# Patient Record
Sex: Female | Born: 1975 | Race: Black or African American | Hispanic: No | Marital: Married | Smoking: Never smoker
Health system: Southern US, Community
[De-identification: ages and names within clinical notes are randomized; demographics above are authoritative.]

## PROBLEM LIST (undated history)

## (undated) DIAGNOSIS — D649 Anemia, unspecified: Secondary | ICD-10-CM

## (undated) DIAGNOSIS — R7303 Prediabetes: Secondary | ICD-10-CM

## (undated) DIAGNOSIS — R87619 Unspecified abnormal cytological findings in specimens from cervix uteri: Secondary | ICD-10-CM

## (undated) DIAGNOSIS — O24419 Gestational diabetes mellitus in pregnancy, unspecified control: Secondary | ICD-10-CM

## (undated) DIAGNOSIS — IMO0002 Reserved for concepts with insufficient information to code with codable children: Secondary | ICD-10-CM

## (undated) DIAGNOSIS — E559 Vitamin D deficiency, unspecified: Secondary | ICD-10-CM

## (undated) HISTORY — DX: Anemia, unspecified: D64.9

## (undated) HISTORY — DX: Prediabetes: R73.03

## (undated) HISTORY — PX: CRYOTHERAPY: SHX1416

## (undated) HISTORY — DX: Gestational diabetes mellitus in pregnancy, unspecified control: O24.419

## (undated) HISTORY — PX: MYOMECTOMY: SHX85

## (undated) HISTORY — DX: Reserved for concepts with insufficient information to code with codable children: IMO0002

## (undated) HISTORY — DX: Vitamin D deficiency, unspecified: E55.9

## (undated) HISTORY — PX: BUNIONECTOMY: SHX129

## (undated) HISTORY — DX: Unspecified abnormal cytological findings in specimens from cervix uteri: R87.619

---

## 2006-12-14 ENCOUNTER — Ambulatory Visit: Payer: Self-pay | Admitting: Obstetrics & Gynecology

## 2006-12-14 ENCOUNTER — Encounter: Payer: Self-pay | Admitting: Obstetrics & Gynecology

## 2007-12-19 ENCOUNTER — Encounter: Payer: Self-pay | Admitting: Family Medicine

## 2007-12-19 ENCOUNTER — Ambulatory Visit: Payer: Self-pay | Admitting: Family Medicine

## 2008-12-23 ENCOUNTER — Encounter: Payer: Self-pay | Admitting: Obstetrics & Gynecology

## 2008-12-23 ENCOUNTER — Ambulatory Visit: Payer: Self-pay | Admitting: Obstetrics & Gynecology

## 2009-06-22 ENCOUNTER — Ambulatory Visit: Payer: Self-pay | Admitting: Obstetrics and Gynecology

## 2010-01-12 ENCOUNTER — Ambulatory Visit: Payer: Self-pay | Admitting: Obstetrics & Gynecology

## 2010-01-31 ENCOUNTER — Ambulatory Visit: Payer: Self-pay | Admitting: Orthopedic Surgery

## 2010-06-29 NOTE — Assessment & Plan Note (Signed)
Cheryl Macias, MCCLATCHY NO.:  [id number]   MEDICAL RECORD NO.:  [id number]          PATIENT TYPE:  POB   LOCATION:  [facility] at [facility]         FACILITY:  [facility]   PHYSICIAN:  Scheryl Darter, MD       DATE OF BIRTH:  May 12, 1975   DATE OF SERVICE:  12/23/2008                                  CLINIC NOTE   The patient comes today for yearly examination.  The patient is a 35-  year-old black female, gravida 2, para 1, abortus 1.  Last menstrual  period December 01, 2008 who is on Previfem daily as the oral  contraceptive.  Menses irregular.   PAST MEDICAL HISTORY:  Oral labial herpes HSV 1.   PAST SURGICAL HISTORY:  None.   MEDICATIONS:  Valtrex 500 mg p.o. p.r.n. for herpes lesion.   PAST OB HISTORY:  Spontaneous vaginal delivery.   PAST GYN HISTORY:  The patient had abnormal Pap smear 6 years ago with  cryotherapy.  Pap smear was normal, sent.   ALLERGIES:  No known drug allergies.   FAMILY HISTORY:  Diabetes, hypertension, and kidney disease in her  father due to lupus.   SOCIAL HISTORY:  She lives with her son.  She works for [employer].   REVIEW OF SYSTEMS:  She says that she has tingling sensation all over  her body, but she says her son and her husband have also experienced it.  She wonders if this might be due to herpes.  No abnormal discharge.  No  headache, shortness breath, chest pain, nausea, vomiting, diarrhea,  constipation, urinary symptoms, blood in her stool, blood in her urine.   PHYSICAL EXAMINATION:  GENERAL:  The patient in no acute distress.  VITAL SIGNS:  Blood pressure is 131/79, weight 138 pounds, pulse 78.  CHEST:  Clear.  HEART:  Regular rhythm.  She has a palpable thyroid that is symmetric.  BREASTS:  Symmetric.  No mass or tenderness.  ABDOMEN:  Soft, nontender, no mass.  EXTREMITIES:  No swelling.  PELVIC:  External genitalia, vagina and cervix appeared normal.  Pap was  done.  Uterus normal size.  No adnexal masses or  tenderness.   IMPRESSION:  The patient has some skin complaints, which may be more  related to environmental exposure.  Recommend that she try using laundry  detergent without any added perfumes.  She has no rash today.  Given a  prescription for valacyclovir 500 mg p.o. b.i.d. for 3 days for outbreak  of her herpes simplex virus.  She will continue with Previfem.      Scheryl Darter, MD     JA/MEDQ  D:  12/23/2008  T:  12/24/2008  Job:  [id number]

## 2010-06-29 NOTE — Assessment & Plan Note (Signed)
Cheryl Macias, Cheryl Macias NO.:  [id number]   MEDICAL RECORD NO.:  [id number]          PATIENT TYPE:  POB   LOCATION:  [facility] at [facility]         FACILITY:  [facility]   PHYSICIAN:  Elsie Lincoln, MD      DATE OF BIRTH:  04/26/1975   DATE OF SERVICE:                                  CLINIC NOTE   The patient is a new patient to this office today.  She comes today from  [place], [place].  She is here today for her yearly Pap and  pelvic.  She had an abnormal Pap smear approximately 3 years ago.  At  that time, she had cryotherapy.  Since then, she has had normal Pap  smears.  She denies any vaginal discharge.  She is currently not  sexually active.  She and her partner have been separated for the past 1  year.  She has regular menstrual cycle.  Last menstrual period was  December 03, 2006.  She is G2, A1, P1.  She has had a mammogram in the  last several years secondary to prolonged lactation following the  delivery of her last child.  This did go on for approximately 5 years  and did resolve approximately 1 year ago.  She would like to discuss  birth control today.  She would like to go back on birth control and is  interested in [place].  She was also at some point exposed to shingles, had  a lesion on her arm and wants to be checked for HSV I and II.  She is  also requesting to be checked for syphilis, gonorrhea, Chlamydia,  hepatitis B and hepatitis C.  While we are checking labs, we will also  check a c-met and TSH.  She is complaining of anxiety which includes  shaking of her hand, racing of her heart and difficulties in social  situations.  She is also complaining of a lesion under her right breast.   PHYSICAL EXAMINATION:  VITAL SIGNS:  Blood pressure is 137/93, pulse 65,  weight 149.  HEENT:  Head is normocephalic and atraumatic.  NECK:  Thyroid seems slightly large.  CARDIAC:  Regular rate and rhythm.  LUNGS:  Clear bilaterally.  ABDOMEN:  Soft,  nontender.  BREASTS:  Symmetrical.  They are somewhat fibrous.  She has an  approximately 1 cm raised lesion on her abdomen below her right breast.  PELVIC:  Externally, there were no lesions or discharges.  Internally,  there is a thin to clear discharge.  Cervix is closed.  There are no  lesions on her cervix.  Bimanual exam:  There is no cervical motion  tenderness, no masses and no adnexal tenderness.  SKIN:  Warm and dry.  EXTREMITIES:  Have good pulses.   ASSESSMENT/PLAN:  1. Yearly Pap and pelvic.  She will follow up in 1 year for return      Pap.  2. Birth control.  She was given samples today of Yaz, 2 packages, as      well as a prescription.  She will start her first pill on the first      Sunday after her menstrual cycle.  Sexually transmitted disease  screen.  We will check her today for herpes simplex virus I and II,      human immunodeficiency virus, syphilis, gonorrhea, Chlamydia,      hepatitis B and hepatitis C.  3. Anxiety.  We will give her Lexapro 10 mg 1/2 tablet p.o. daily.  We      will also check a chem-20 as well as thyroid-stimulating hormone.  4. For her raised lesion, she is referred to a dermatologist in      [place].  5. Patient is asked to follow up in 4 weeks concerning her Lexapro.      She will be called with the results of her labs and her Pap smear.      If she has any questions in the meantime, she should call the      office.      Remonia Richter, NP    ______________________________  Elsie Lincoln, MD    LR/MEDQ  D:  12/14/2006  T:  12/14/2006  Job:  [id number]

## 2010-06-29 NOTE — Assessment & Plan Note (Signed)
NAMEJAZMA, PICKEL NO.:  [id number]   MEDICAL RECORD NO.:  [id number]          PATIENT TYPE:  POB   LOCATION:  [facility]         FACILITY:  [facility]   PHYSICIAN:  Jaynie Collins, MD     DATE OF BIRTH:  [birth year]   DATE OF SERVICE:  01/12/2010                                  CLINIC NOTE   REASON FOR VISIT:  Annual examination.   [name] is a 35 year old gravida 2, para 1-0-1-1 with a last  menstrual period of December 30, 2009, who is here for her annual  examination.  The patient has no gynecologic concerns.  She is on  Previfem for contraception and denies any gynecologic symptoms.   PAST OB/GYN HISTORY:  The patient has had 1 spontaneous vaginal  delivery.  The patient had an abnormal Pap smear 7 years ago and needed  cryotherapy for it.  Her followup Pap smears have been normal.  She  denies any sexually transmitted infections or any other gynecologic  conditions.   PAST MEDICAL HISTORY:  Oral labial herpes simplex virus 1 infection  periodically.   PAST SURGICAL HISTORY:  Bunionectomy in both feet.   MEDICATIONS:  Valtrex 500 mg as needed for cold sores and Previfem for  contraception.   ALLERGIES:  No known drug allergies.   SOCIAL HISTORY:  The patient lives with her family.  She works as a  Retail buyer for [employer].  She denies any alcohol or illicit drugs or  tobacco use.   FAMILY HISTORY:  Remarkable for diabetes, hypertension, and kidney  disease secondary to lupus.  No cancer history.   REVIEW OF SYSTEMS:  Entirely negative.   PHYSICAL EXAMINATION:  VITAL SIGNS:  Blood pressure 135/84, pulse 74,  weight 146.5 pounds, height 5 feet 4 inches.  GENERAL:  No apparent distress.  HEENT:  Normocephalic, atraumatic.  NECK:  Supple.  Normal thyroid.  No abnormal masses.  LUNGS:  Clear to auscultation bilaterally.  HEART:  Regular rate and rhythm.  ABDOMEN:  Soft, nontender, nondistended.  BREASTS:  Symmetric in size, soft,  nontender.  No abnormal masses, skin  changes, lymphadenopathy, or drainage noted.  EXTREMITIES:  No clubbing, cyanosis, or edema.  PELVIC:  Normal external female genitalia.  Pink, well rugated vagina,  normal discharge.  Normal cervical contour.  Pap smear was obtained.  Uterus is retroverted and small, mobile, nontender.  Normal adnexa  bilaterally.   ASSESSMENT AND PLAN:  The patient is a 35 year old gravida 2, para 1-0-1-  1 here for annual examination.  She had a normal breast examination.  We  will follow up on her Pap smear.  The patient desired sexually  transmitted infection screening.  An ancillary testing for gonorrhea,  chlamydia, and trichomonas will be done from her Pap smear sample and we  will follow up those results.  The patient will have serum tested for  HIV, hepatitis B, hepatitis C, and syphilis through her [facility] and a  prescription was written with this labs.  She was told to call or come  back in for any further gynecologic concerns.           ______________________________  Jaynie Collins, MD  UA/MEDQ  D:  01/12/2010  T:  01/13/2010  Job:  [id number]

## 2010-06-29 NOTE — Assessment & Plan Note (Signed)
NAME:  Cheryl Macias, Cheryl Macias NO.:  [id number]   MEDICAL RECORD NO.:  [id number]          PATIENT TYPE:  POB   LOCATION:  [facility]         FACILITY:  [facility]   PHYSICIAN:  Argentina Donovan, MD        DATE OF BIRTH:  December 15, 1975   DATE OF SERVICE:  06/22/2009                                  CLINIC NOTE   The patient is a 35 year old African American female gravida 2, para 1-0-  1-1 who had her last complete physical examination on November of last  year.  She does normal self examinations of the breast each month.  She  is 5 feet 4 and weighs 144 pounds and she has large pendulous breasts  and noticed a mass in the lower inner portion of the left breast on a  recent examination substantiated by her husband who told her to feel a  right away.   On examination, with exception of the significant area of the breasts  are symmetrical with no other dominant masses and no nipple discharge.  No supraclavicular or axillary nodes.  However, in the lower inner  quadrant of the left breast between 6 and 8 o'clock there is any  irregular soft, feels almost lobulated mass gives the impression of a  lipoma not separated from the skin, not cystic to palpation.  We are  going to send the patient for a mammogram and ultrasound of nomenclature  we were dealing with here, but she definitely has a palpable mass at  this time.  She is having her period at this point,  however, I think  that the mass is significant enough that it indicates further followup.   IMPRESSION:  Left breast mass.           ______________________________  Argentina Donovan, MD     PR/MEDQ  D:  06/22/2009  T:  06/23/2009  Job:  [id number]

## 2010-07-02 NOTE — Assessment & Plan Note (Signed)
NAME:  Cheryl Macias, Cheryl Macias NO.:  [id number]   MEDICAL RECORD NO.:  [id number]          PATIENT TYPE:  POB   LOCATION:  [facility]         [facility]ACILITY:  [facility]   PHYSICIAN:  Tinnie Gens, MD        DATE OF BIRTH:  1975/08/16   DATE OF SERVICE:                                  CLINIC NOTE   CHIEF COMPLAINT:  Yearly exam.   HISTORY OF PRESENT ILLNESS:  The patient is a 35 year old gravida 2,  para 1, who is here for yearly exam.  She was last seen a year ago for  yearly.  She has had a new partner.  Since her last visit, she has  vaginal discharge and likes to be checked for all STDs.  The patient is  on Sprintec, which works well for her.  She has no complaints about  that.   PAST MEDICAL HISTORY:  Negative.   PAST SURGICAL HISTORY:  Negative.   OBSTETRICAL HISTORY:  She is G2, P1, 1 SVD.  She has history of menarche  at age 25.  Cycles are monthly.  She has had a history of abnormal Pap,  status post cryo in the past.   MEDICATIONS:  Sprintec 1 p.o. daily.   ALLERGIES:  None known.   FAMILY HISTORY:  Diabetes and hypertension.   SOCIAL HISTORY:  She lives with her son.  She works for [employer].   REVIEW OF SYSTEMS:  A 14-point review of systems is reviewed.  She  denies significant headaches, shortness of breath, chest pain, nausea,  vomiting, diarrhea, constipation, dysuria, blood in her stools, blood in  her urine, chest pain, shortness of breath, or headaches.   PHYSICAL EXAMINATION:  VITAL SIGNS:  Blood pressure is 126/85, pulse 78,  and weight 136.  GENERAL:  She is a well-developed and well-nourished female in no acute  distress.  HEENT:  Normocephalic, atraumatic.  Sclerae anicteric.  NECK:  Supple.  Normal thyroid.  LUNGS:  Clear bilaterally.  CV:  Regular rate and rhythm.  No murmurs, rubs, or gallops.  ABDOMEN:  Soft, nontender, and nondistended.  GU:  Normal external female genitalia.  BUS normal.  Vagina is pink and  rugated.  Cervix is  parous without lesions.  Uterus is small,  anteverted.  No adnexal masses or tenderness.  EXTREMITIES:  No cyanosis, clubbing, or edema.   IMPRESSION:  1. Yearly exam.  2. Sexually transmitted disease check.   PLAN:  1. Pap smear today for GC and chlamydia.  2. RPR and HIV.           ______________________________  Tinnie Gens, MD     TP/MEDQ  D:  12/20/2007  T:  12/21/2007  Job:  [id number]

## 2011-02-03 ENCOUNTER — Ambulatory Visit: Payer: Self-pay | Admitting: Obstetrics & Gynecology

## 2011-03-08 ENCOUNTER — Ambulatory Visit (INDEPENDENT_AMBULATORY_CARE_PROVIDER_SITE_OTHER): Payer: 59 | Admitting: Obstetrics and Gynecology

## 2011-03-08 ENCOUNTER — Encounter: Payer: Self-pay | Admitting: Obstetrics and Gynecology

## 2011-03-08 VITALS — BP 124/78 | HR 71 | Ht 64.0 in | Wt 149.0 lb

## 2011-03-08 DIAGNOSIS — Z1272 Encounter for screening for malignant neoplasm of vagina: Secondary | ICD-10-CM

## 2011-03-08 DIAGNOSIS — Z01419 Encounter for gynecological examination (general) (routine) without abnormal findings: Secondary | ICD-10-CM

## 2011-03-08 MED ORDER — NORGESTIMATE-ETH ESTRADIOL 0.25-35 MG-MCG PO TABS: 1.0000 | ORAL_TABLET | Freq: Every day | ORAL | Status: DC

## 2011-03-08 NOTE — Progress Notes (Signed)
  Subjective:     Cheryl Macias is a 36 y.o. AA female G2P1011 with LMP 02/23/2011 and BMI 25and is here for a comprehensive physical exam. The patient reports no problems. Patient using Sprintec for birth control and is happy with her current method. Patient is planning on trying to conceive at the end of the year.  History   Social History  . Marital Status: Single    Spouse Name: N/A    Number of Children: N/A  . Years of Education: N/A   Occupational History  . Not on file.   Social History Main Topics  . Smoking status: Never Smoker   . Smokeless tobacco: Not on file  . Alcohol Use: No  . Drug Use: No  . Sexually Active: Yes -- Female partner(s)    Birth Control/ Protection: Pill   Other Topics Concern  . Not on file   Social History Narrative  . No narrative on file   Health Maintenance  Topic Date Due  . Pap Smear  01/02/1996  . Tetanus/tdap  01/01/1997  . Influenza Vaccine  11/15/2010       Review of Systems A comprehensive review of systems was negative.   Objective:   GENERAL: Well-developed, well-nourished female in no acute distress.  HEENT: Normocephalic, atraumatic. Sclerae anicteric.  NECK: Supple. Normal thyroid.  LUNGS: Clear to auscultation bilaterally.  HEART: Regular rate and rhythm. BREASTS: Symmetric in size. No palpable masses or lymphadenopathy, skin changes, or nipple drainage. ABDOMEN: Soft, nontender, nondistended. No organomegaly. PELVIC: Normal external female genitalia. Vagina is pink and rugated.  Normal discharge. Normal appearing cervix. Uterus is normal in size. No adnexal mass or tenderness. EXTREMITIES: No cyanosis, clubbing, or edema, 2+ distal pulses.    Assessment:    Healthy female exam.      Plan:   Pap smears performed Patient advised to exercise regularly (150 min/week) Patient advised to continue monthly self breast exam Patient advised to take PNV now and definitely at least 3 months before trying to  conceive See After Visit Summary for Counseling Recommendations

## 2011-03-08 NOTE — Patient Instructions (Signed)
Preventative Care for Adults, Female A healthy lifestyle and preventative care can promote health and wellness. Preventative health guidelines for women include the following key practices:  A routine yearly physical is a good way to check with your caregiver about your health and preventative screening. It is a chance to share any concerns and updates on your health, and to receive a thorough exam.   Visit your dentist for a routine exam and preventative care every 6 months. Brush your teeth twice a day and floss once a day. Good oral hygiene prevents tooth decay and gum disease.   The frequency of eye exams is based on your age, health, family medical history, use of contact lenses, and other factors. Follow your caregiver's recommendations for frequency of eye exams.   Eat a healthy diet. Foods like vegetables, fruits, whole grains, low-fat dairy products, and lean protein foods contain the nutrients you need without too many calories. Decrease your intake of foods high in solid fats, added sugars, and salt. Eat the right amount of calories for you.Get information about a proper diet from your caregiver, if necessary.   Regular physical exercise is one of the most important things you can do for your health. Most adults should get at least 150 minutes of moderate-intensity exercise (any activity that increases your heart rate and causes you to sweat) each week. In addition, most adults need muscle-strengthening exercises on 2 or more days a week.   Maintain a healthy weight. The body mass index (BMI) is a screening tool to identify possible weight problems. It provides an estimate of body fat based on height and weight. Your caregiver can help determine your BMI, and can help you achieve or maintain a healthy weight.For adults 20 years and older:   A BMI below 18.5 is considered underweight.   A BMI of 18.5 to 24.9 is normal.   A BMI of 25 to 29.9 is considered overweight.   A BMI of 30 and  above is considered obese.   Maintain normal blood lipids and cholesterol levels by exercising and minimizing your intake of saturated fat. Eat a balanced diet with plenty of fruit and vegetables. Blood tests for lipids and cholesterol should begin at age 20 and be repeated every 5 years. If your lipid or cholesterol levels are high, you are over 50, or you are a high risk for heart disease, you may need your cholesterol levels checked more frequently.Ongoing high lipid and cholesterol levels should be treated with medicines if diet and exercise are not effective.   If you smoke, find out from your caregiver how to quit. If you do not use tobacco, do not start.   If you are pregnant, do not drink alcohol. If you are breastfeeding, be very cautious about drinking alcohol. If you are not pregnant and choose to drink alcohol, do not exceed 1 drink per day. One drink is considered to be 12 ounces (355 mL) of beer, 5 ounces (148 mL) of wine, or 1.5 ounces (44 mL) of liquor.   Avoid use of street drugs. Do not share needles with anyone. Ask for help if you need support or instructions about stopping the use of drugs.   High blood pressure causes heart disease and increases the risk of stroke. Your blood pressure should be checked at least every 1 to 2 years. Ongoing high blood pressure should be treated with medicines if weight loss and exercise are not effective.   If you are 55 to 34   years old, ask your caregiver if you should take aspirin to prevent strokes.   Diabetes screening involves taking a blood sample to check your fasting blood sugar level. This should be done once every 3 years, after age 45, if you are within normal weight and without risk factors for diabetes. Testing should be considered at a younger age or be carried out more frequently if you are overweight and have at least 1 risk factor for diabetes.   Breast cancer screening is essential preventative care for women. You should  practice "breast self-awareness." This means understanding the normal appearance and feel of your breasts and may include breast self-examination. Any changes detected, no matter how small, should be reported to a caregiver. Women in their 20s and 30s should have a clinical breast exam (CBE) by a caregiver as part of a regular health exam every 1 to 3 years. After age 40, women should have a CBE every year. Starting at age 40, women should consider having a mammogram (breast X-ray) every year. Women who have a family history of breast cancer should talk to their caregiver about genetic screening. Women at a high risk of breast cancer should talk to their caregiver about having an MRI and a mammogram every year.   The Pap test is a screening test for cervical cancer. A Pap test can show cell changes on the cervix that might become cervical cancer if left untreated. A Pap test is a procedure in which cells are obtained and examined from the lower end of the uterus (cervix).   Women should have a Pap test starting at age 21.   Between ages 21 and 29, Pap tests should be repeated every 2 years.   Beginning at age 30, you should have a Pap test every 3 years as long as the past 3 Pap tests have been normal.   Some women have medical problems that increase the chance of getting cervical cancer. Talk to your caregiver about these problems. It is especially important to talk to your caregiver if a new problem develops soon after your last Pap test. In these cases, your caregiver may recommend more frequent screening and Pap tests.   The above recommendations are the same for women who have or have not gotten the vaccine for human papillomavirus (HPV).   If you had a hysterectomy for a problem that was not cancer or a condition that could lead to cancer, then you no longer need Pap tests. Even if you no longer need a Pap test, a regular exam is a good idea to make sure no other problems are starting.   If you  are between ages 65 and 70, and you have had normal Pap tests going back 10 years, you no longer need Pap tests. Even if you no longer need a Pap test, a regular exam is a good idea to make sure no other problems are starting.   If you have had past treatment for cervical cancer or a condition that could lead to cancer, you need Pap tests and screening for cancer for at least 20 years after your treatment.   If Pap tests have been discontinued, risk factors (such as a new sexual partner) need to be reassessed to determine if screening should be resumed.   The HPV test is an additional test that may be used for cervical cancer screening. The HPV test looks for the virus that can cause the cell changes on the cervix. The cells collected   during the Pap test can be tested for HPV. The HPV test could be used to screen women aged 30 years and older, and should be used in women of any age who have unclear Pap test results. After the age of 30, women should have HPV testing at the same frequency as a Pap test.   Colorectal cancer can be detected and often prevented. Most routine colorectal cancer screening begins at the age of 50 and continues through age 75. However, your caregiver may recommend screening at an earlier age if you have risk factors for colon cancer. On a yearly basis, your caregiver may provide home test kits to check for hidden blood in the stool. Use of a small camera at the end of a tube, to directly examine the colon (sigmoidoscopy or colonoscopy), can detect the earliest forms of colorectal cancer. Talk to your caregiver about this at age 50, when routine screening begins. Direct examination of the colon should be repeated every 5 to 10 years through age 75, unless early forms of pre-cancerous polyps or small growths are found.   Practice safe sex. Use condoms and avoid high-risk sexual practices to reduce the spread of sexually transmitted infections (STIs). STIs include gonorrhea,  chlamydia, syphilis, trichomonas, herpes, HPV, and human immunodeficiency virus (HIV). Herpes, HIV, and HPV are viral illnesses that have no cure. They can result in disability, cancer, and death. Sexually active women aged 25 and younger should be checked for Chlamydia. Older women with new or multiple partners should also be tested for Chlamydia. Testing for other STIs is recommended if you are sexually active and at increased risk.   Osteoporosis is a disease in which the bones lose minerals and strength with aging. This can result in serious bone fractures. The risk of osteoporosis can be identified using a bone density scan. Women ages 65 and over and women at risk for fractures or osteoporosis should discuss screening with their caregivers. Ask your caregiver whether you should take a calcium supplement or vitamin D to reduce the rate of osteoporosis.   Menopause can be associated with physical symptoms and risks. Hormone replacement therapy is available to decrease symptoms and risks. You should talk to your caregiver about whether hormone replacement therapy is right for you.   Use sunscreen with skin protection factor (SPF) of 30 or more. Apply sunscreen liberally and repeatedly throughout the day. You should seek shade when your shadow is shorter than you. Protect yourself by wearing long sleeves, pants, a wide-brimmed hat, and sunglasses year round, whenever you are outdoors.   Once a month, do a whole body skin exam, using a mirror to look at the skin on your back. Notify your caregiver of new moles, moles that have irregular borders, moles that are larger than a pencil eraser, or moles that have changed in shape or color.   Stay current with required immunizations.   Influenza. You need a dose every fall (or winter). The composition of the flu vaccine changes each year, so being vaccinated once is not enough.   Pneumococcal polysaccharide. You need 1 to 2 doses if you smoke cigarettes or  if you have certain chronic medical conditions. You need 1 dose at age 65 (or older) if you have never been vaccinated.   Tetanus, diphtheria, pertussis (Tdap, Td). Get 1 dose of Tdap vaccine if you are younger than age 65 years, are over 65 and have contact with an infant, are a healthcare worker, are pregnant, or simply want   to be protected from whooping cough. After that, you need a Td booster dose every 10 years. Consult your caregiver if you have not had at least 3 tetanus and diphtheria-containing shots sometime in your life or have a deep or dirty wound.   HPV. You need this vaccine if you are a woman age 26 years or younger. The vaccine is given in 3 doses over 6 months.   Measles, mumps, rubella (MMR). You need at least 1 dose of MMR if you were born in 1957 or later. You may also need a 2nd dose.   Meningococcal. If you are age 19 to 21 years and a first-year college student living in a residence hall, or have one of several medical conditions, you need to get vaccinated against meningococcal disease. You may also need additional booster doses.   Zoster (shingles). If you are age 60 years or older, you should get this vaccine.   Varicella (chickenpox). If you have never had chickenpox or you were vaccinated but received only 1 dose, talk to your caregiver to find out if you need this vaccine.   Hepatitis A. You need this vaccine if you have a specific risk factor for hepatitis A virus infection or you simply wish to be protected from this disease. The vaccine is usually given as 2 doses, 6 to 18 months apart.   Hepatitis B. You need this vaccine if you have a specific risk factor for hepatitis B virus infection or you simply wish to be protected from this disease. The vaccine is given in 3 doses, usually over 6 months.  Preventative Services / Frequency Ages 19 to 39  Blood pressure check.** / Every 1 to 2 years.   Lipid and cholesterol check.**/ Every 5 years beginning at age 20.    Clinical breast exam.** / Every 3 years for women in their 20s and 30s.   Pap Test.** / Every 2 years from ages 21 through 29. Every 3 years starting at age 30 years through age 65 or 70 with a history of 3 consecutive normal Pap tests.   HPV Screening.** / Every 3 years from ages 30 through ages 65 to 70 with a history of 3 consecutive normal Pap tests.   Skin self-exam. / Monthly.   Influenza immunization.** / Every year.   Pneumococcal polysaccharide immunization.** / 1 to 2 doses if you smoke cigarettes or if you have certain chronic medical conditions.   Tetanus, diphtheria, pertussis (Tdap,Td) immunization. / A one-time dose of Tdap vaccine. After that, you need a Td booster dose every 10 years.   HPV immunization. / 3 doses over 6 months, if 26 and younger.   Measles, mumps, rubella (MMR) immunization. / You need at least 1 dose of MMR if you were born in 1957 or later. You may also need a 2nd dose.   Meningococcal immunization. / 1 dose if you are age 19 to 21 years and a first-year college student living in a residence hall, or have one of several medical conditions, you need to get vaccinated against meningococcal disease. You may also need additional booster doses.   Varicella immunization. **/ Consult your caregiver.   Hepatitis A immunization. ** / Consult your caregiver. 2 doses, 6 to 18 months apart.   Hepatitis B immunization.** / Consult your caregiver. 3 doses usually over 6 months.   Document Released: 03/29/2001 Document Revised: 10/13/2010 Document Reviewed: 06/28/2010 [employer] Patient Information 2012 [employer].  

## 2011-04-21 ENCOUNTER — Telehealth: Payer: Self-pay | Admitting: *Deleted

## 2011-04-21 DIAGNOSIS — B009 Herpesviral infection, unspecified: Secondary | ICD-10-CM

## 2011-04-21 MED ORDER — VALACYCLOVIR HCL 500 MG PO TABS: 500.0000 mg | ORAL_TABLET | Freq: Two times a day (BID) | ORAL | Status: DC

## 2011-04-21 NOTE — Telephone Encounter (Signed)
Patient is wishing to have a 90 day rx sent to her mail order company.

## 2012-10-12 ENCOUNTER — Telehealth: Payer: Self-pay | Admitting: *Deleted

## 2012-10-12 DIAGNOSIS — B009 Herpesviral infection, unspecified: Secondary | ICD-10-CM

## 2012-10-12 MED ORDER — VALACYCLOVIR HCL 500 MG PO TABS: 500.0000 mg | ORAL_TABLET | Freq: Two times a day (BID) | ORAL | Status: DC

## 2012-10-12 NOTE — Telephone Encounter (Signed)
Patient is requesting refill of Valtrex.

## 2013-03-22 LAB — HM PAP SMEAR: HM Pap smear: NEGATIVE

## 2013-12-16 ENCOUNTER — Encounter: Payer: Self-pay | Admitting: Obstetrics and Gynecology

## 2015-03-30 ENCOUNTER — Ambulatory Visit: Payer: Self-pay

## 2015-03-30 ENCOUNTER — Ambulatory Visit (INDEPENDENT_AMBULATORY_CARE_PROVIDER_SITE_OTHER): Payer: 59 | Admitting: Podiatry

## 2015-03-30 ENCOUNTER — Ambulatory Visit (INDEPENDENT_AMBULATORY_CARE_PROVIDER_SITE_OTHER): Payer: 59

## 2015-03-30 ENCOUNTER — Encounter: Payer: Self-pay | Admitting: Podiatry

## 2015-03-30 VITALS — BP 106/66 | HR 76 | Resp 16 | Ht 64.0 in | Wt 153.0 lb

## 2015-03-30 DIAGNOSIS — D169 Benign neoplasm of bone and articular cartilage, unspecified: Secondary | ICD-10-CM | POA: Diagnosis not present

## 2015-03-30 DIAGNOSIS — M79672 Pain in left foot: Secondary | ICD-10-CM

## 2015-03-30 DIAGNOSIS — Z472 Encounter for removal of internal fixation device: Secondary | ICD-10-CM

## 2015-03-30 DIAGNOSIS — M79671 Pain in right foot: Secondary | ICD-10-CM | POA: Diagnosis not present

## 2015-03-30 NOTE — Progress Notes (Signed)
   Subjective:    Patient ID: Cheryl Macias, female    DOB: 03-27-1975, 40 y.o.   MRN: [id number]  HPI Patient presents with foot pain in their left foot; bunionectomy 8 yrs ago. Pt stated, "Can feel pin and wants to have it checked-make sure bunion not growing back".  Pt had bilateral bunionectomy 8 years ago.   Review of Systems  All other systems reviewed and are negative.      Objective:   Physical Exam        Assessment & Plan:

## 2015-03-30 NOTE — Progress Notes (Signed)
Subjective:     Patient ID: Cheryl Macias, female   DOB: 1975/12/12, 40 y.o.   MRN: [id number]  HPI patient states I've had some prominence of I think this pain on the left foot and also a little discomfort on the inside with what feels like a bone spur that's gradually given me some discomfort over the last year. I've tried wider shoes and soaks   Review of Systems  All other systems reviewed and are negative.      Objective:   Physical Exam  Constitutional: She is oriented to person, place, and time.  Cardiovascular: Intact distal pulses.   Musculoskeletal: Normal range of motion.  Neurological: She is oriented to person, place, and time.  Skin: Skin is warm.  Nursing note and vitals reviewed.  neurovascular status found to be intact muscle strength adequate range of motion within normal limits with patient found to have discomfort on the dorsum of the left foot where there is a prominent pin position and a small spur on the inside the left first metatarsal. Both right and left foot show well-healing surgical sites from previous surgery and patient is noted to have good digital perfusion and is well oriented 3     Assessment:      abnormal pin position left with possible small bone spur on the medial aspect left first metatarsal    Plan:      x-rays an H&P reviewed with patient. At this point we discussed possibility for pin removal and removal of bone spur left foot and she would like to do this but cannot do it at this time and await to see if she becomes more symptomatic. I went ahead and reviewed this with her and what type procedure be necessary to remove the spur and the pin   X-ray report bilateral indicated pins are in place bilateral good correction of the first MPJ is in place with a small spur on the medial side first metatarsal left

## 2015-09-15 ENCOUNTER — Other Ambulatory Visit: Payer: Self-pay | Admitting: Obstetrics and Gynecology

## 2015-09-15 DIAGNOSIS — N644 Mastodynia: Secondary | ICD-10-CM

## 2015-09-15 DIAGNOSIS — N63 Unspecified lump in unspecified breast: Secondary | ICD-10-CM

## 2015-09-30 ENCOUNTER — Other Ambulatory Visit: Payer: Self-pay

## 2015-09-30 ENCOUNTER — Ambulatory Visit: Payer: Self-pay

## 2015-10-14 ENCOUNTER — Ambulatory Visit: Payer: Self-pay

## 2015-10-14 ENCOUNTER — Other Ambulatory Visit: Payer: Self-pay

## 2015-10-27 ENCOUNTER — Other Ambulatory Visit: Payer: Self-pay

## 2015-10-27 ENCOUNTER — Ambulatory Visit: Payer: Self-pay

## 2015-11-12 ENCOUNTER — Ambulatory Visit
Admission: RE | Admit: 2015-11-12 | Discharge: 2015-11-12 | Disposition: A | Discharge status: Home / Self Care | Payer: 59 | Source: Ambulatory Visit | Attending: Obstetrics and Gynecology | Admitting: Obstetrics and Gynecology

## 2015-11-12 DIAGNOSIS — N63 Unspecified lump in unspecified breast: Secondary | ICD-10-CM

## 2015-11-12 DIAGNOSIS — N644 Mastodynia: Secondary | ICD-10-CM

## 2016-05-03 ENCOUNTER — Ambulatory Visit (INDEPENDENT_AMBULATORY_CARE_PROVIDER_SITE_OTHER): Payer: 59 | Admitting: Obstetrics and Gynecology

## 2016-05-03 ENCOUNTER — Encounter: Payer: Self-pay | Admitting: Obstetrics and Gynecology

## 2016-05-03 VITALS — BP 102/62 | HR 64 | Ht 64.0 in | Wt 149.0 lb

## 2016-05-03 DIAGNOSIS — Z0000 Encounter for general adult medical examination without abnormal findings: Secondary | ICD-10-CM | POA: Diagnosis not present

## 2016-05-03 DIAGNOSIS — K625 Hemorrhage of anus and rectum: Secondary | ICD-10-CM

## 2016-05-03 DIAGNOSIS — Z1321 Encounter for screening for nutritional disorder: Secondary | ICD-10-CM | POA: Diagnosis not present

## 2016-05-03 DIAGNOSIS — Z1151 Encounter for screening for human papillomavirus (HPV): Secondary | ICD-10-CM | POA: Diagnosis not present

## 2016-05-03 DIAGNOSIS — K644 Residual hemorrhoidal skin tags: Secondary | ICD-10-CM

## 2016-05-03 DIAGNOSIS — Z113 Encounter for screening for infections with a predominantly sexual mode of transmission: Secondary | ICD-10-CM

## 2016-05-03 DIAGNOSIS — Z124 Encounter for screening for malignant neoplasm of cervix: Secondary | ICD-10-CM | POA: Diagnosis not present

## 2016-05-03 DIAGNOSIS — Z1329 Encounter for screening for other suspected endocrine disorder: Secondary | ICD-10-CM

## 2016-05-03 DIAGNOSIS — L608 Other nail disorders: Secondary | ICD-10-CM

## 2016-05-03 DIAGNOSIS — Z131 Encounter for screening for diabetes mellitus: Secondary | ICD-10-CM | POA: Diagnosis not present

## 2016-05-03 DIAGNOSIS — B001 Herpesviral vesicular dermatitis: Secondary | ICD-10-CM | POA: Insufficient documentation

## 2016-05-03 DIAGNOSIS — Z01419 Encounter for gynecological examination (general) (routine) without abnormal findings: Secondary | ICD-10-CM

## 2016-05-03 LAB — HEMOCCULT GUIAC POC 1CARD (OFFICE): Fecal Occult Blood, POC: NEGATIVE

## 2016-05-03 NOTE — Progress Notes (Signed)
HPI:      Cheryl Macias is a 41 y.o. G2P0011 who LMP was Patient's last menstrual period was 04/19/2016 (approximate)., presents today for her annual examination.  Her menses are regular every 28-30 days, lasting 5 days.  Dysmenorrhea none. She does not have intermenstrual bleeding.  Sex activity: not sexually active.  Last Pap: March 22, 2013  Results were: no abnormalities /neg HPV DNA  Hx of STDs: HPV. She would like full STD testing.   Mammo: 11/12/15 Cat 1. Done due to LT breast pain, possible mass. Sx resolved. There is no FH of breast cancer. There is no FH of ovarian cancer. The patient does do self-breast exams.  Tobacco use: The patient denies current or previous tobacco use. Alcohol use: none Exercise: moderately active  She does get adequate calcium and Vitamin D in her diet.  She had normal labs/lipid 2017. She  has a hx of pre-DM and would like HgA1C done today. She is not fasting.  She has noticed brown lines in her fingernails and toenails. She is concerned about them. She has had them for years but they are getting darker/wider.   She complains BRBPR with occas BMs. Sx occur a couple times a month for the past 6 months. She denies any hx of constipation/hard stools/diarrhea.   Past Medical History:  Diagnosis Date  . Abnormal Pap smear   . Prediabetes   . Vitamin D deficiency     Past Surgical History:  Procedure Laterality Date  . BUNIONECTOMY     BOTH FEET  . CRYOTHERAPY      Family History  Problem Relation Age of Onset  . Hypertension Father   . Diabetes Paternal Grandmother   . Breast cancer Neg Hx      ROS:  Review of Systems  Constitutional: Negative for fever, malaise/fatigue and weight loss.  HENT: Negative for congestion, ear pain and sinus pain.   Respiratory: Negative for cough, shortness of breath and wheezing.   Cardiovascular: Negative for chest pain, orthopnea and leg swelling.  Gastrointestinal: Negative for  constipation, diarrhea, nausea and vomiting.  Genitourinary: Negative for dysuria, frequency, hematuria and urgency.       Breast ROS: negative   Musculoskeletal: Negative for back pain, joint pain and myalgias.  Skin: Negative for itching and rash.  Neurological: Negative for dizziness, tingling, focal weakness and headaches.  Endo/Heme/Allergies: Negative for environmental allergies. Does not bruise/bleed easily.  Psychiatric/Behavioral: Negative for depression and suicidal ideas. The patient is not nervous/anxious and does not have insomnia.     Objective: BP 102/62   Pulse 64   Ht 5\' 4"  (1.626 m)   Wt 149 lb (67.6 kg)   LMP 04/19/2016 (Approximate)   BMI 25.58 kg/m    Physical Exam  Constitutional: She is oriented to person, place, and time. She appears well-developed and well-nourished.  Genitourinary: Vagina normal and uterus normal. No erythema or tenderness in the vagina. No vaginal discharge found. Right adnexum does not display mass and does not display tenderness. Left adnexum does not display mass and does not display tenderness. Cervix does not exhibit motion tenderness or polyp. Uterus is not enlarged or tender. Rectal exam shows external hemorrhoid and internal hemorrhoid. Rectal exam shows no fissure, no mass and guaiac negative stool.  Neck: Normal range of motion. No thyromegaly present.  Cardiovascular: Normal rate, regular rhythm and normal heart sounds.   No murmur heard. Pulmonary/Chest: Effort normal and breath sounds normal. Right breast exhibits no mass,  no nipple discharge, no skin change and no tenderness. Left breast exhibits no mass, no nipple discharge, no skin change and no tenderness.  Abdominal: Soft. There is no tenderness. There is no guarding.  Musculoskeletal: Normal range of motion.  Neurological: She is alert and oriented to person, place, and time. No cranial nerve deficit.  Psychiatric: She has a normal mood and affect. Her behavior is normal.    Vitals reviewed.   Results: Results for orders placed or performed in visit on 05/03/16  POCT Occult Blood Stool  Result Value Ref Range   Fecal Occult Blood, POC Negative Negative   Card #1 Date     Card #2 Fecal Occult Blod, POC     Card #2 Date     Card #3 Fecal Occult Blood, POC     Card #3 Date      Assessment/Plan: Encounter for annual routine gynecological examination  Cervical cancer screening - Plan: IGP,CtNgTv,Apt HPV,rfx16/18,45, CANCELED: IGP, Aptima HPV  Screening for HPV (human papillomavirus) - Plan: IGP,CtNgTv,Apt HPV,rfx16/18,45, CANCELED: IGP, Aptima HPV  Screening for STD (sexually transmitted disease) - Plan: IGP,CtNgTv,Apt HPV,rfx16/18,45, HIV antibody, RPR, Hepatitis C antibody, HSV 2 antibody, IgG  Blood tests for routine general physical examination - Plan: TSH, Hemoglobin A1c, Comprehensive metabolic panel, VITAMIN D 25 Hydroxy (Vit-D Deficiency, Fractures)  Encounter for vitamin deficiency screening - Plan: VITAMIN D 25 Hydroxy (Vit-D Deficiency, Fractures)  Thyroid disorder screening - Check due to melanonychia. - Plan: TSH  Screening for diabetes mellitus - Plan: Hemoglobin A1c  Rectal bleeding - Neg FOBT. Ext hem. Keep stools soft. OTC meds. F/u prn. 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 Check labs. If neg, reassurance. No evid of melanoma of nail.               GYN counsel breast self exam, adequate intake of calcium and vitamin D, diet and exercise     F/U  Return in about 1 year (around 05/03/2017).  Londyn Wotton B. Cope Marte, PA-C 05/03/2016 11:20 AM

## 2016-05-04 LAB — COMPREHENSIVE METABOLIC PANEL
A/G RATIO: 1.4 (ref 1.2–2.2)
ALK PHOS: 44 IU/L (ref 39–117)
ALT: 9 IU/L (ref 0–32)
AST: 13 IU/L (ref 0–40)
Albumin: 4 g/dL (ref 3.5–5.5)
BILIRUBIN TOTAL: 0.5 mg/dL (ref 0.0–1.2)
BUN/Creatinine Ratio: 10 (ref 9–23)
BUN: 8 mg/dL (ref 6–20)
CHLORIDE: 102 mmol/L (ref 96–106)
CO2: 28 mmol/L (ref 18–29)
Calcium: 8.7 mg/dL (ref 8.7–10.2)
Creatinine, Ser: 0.82 mg/dL (ref 0.57–1.00)
GFR calc non Af Amer: 91 mL/min/{1.73_m2} (ref >59)
GFR, EST AFRICAN AMERICAN: 105 mL/min/{1.73_m2} (ref >59)
Globulin, Total: 2.8 g/dL (ref 1.5–4.5)
Glucose: 78 mg/dL (ref 65–99)
POTASSIUM: 4.2 mmol/L (ref 3.5–5.2)
Sodium: 139 mmol/L (ref 134–144)
TOTAL PROTEIN: 6.8 g/dL (ref 6.0–8.5)

## 2016-05-04 LAB — HIV ANTIBODY (ROUTINE TESTING W REFLEX): HIV Screen 4th Generation wRfx: NONREACTIVE

## 2016-05-04 LAB — HEPATITIS C ANTIBODY

## 2016-05-04 LAB — HEMOGLOBIN A1C
ESTIMATED AVERAGE GLUCOSE: 108 mg/dL
HEMOGLOBIN A1C: 5.4 % (ref 4.8–5.6)

## 2016-05-04 LAB — HSV 2 ANTIBODY, IGG

## 2016-05-04 LAB — TSH: TSH: 1.4 u[IU]/mL (ref 0.450–4.500)

## 2016-05-04 LAB — RPR: RPR Ser Ql: NONREACTIVE

## 2016-05-07 LAB — IGP,CTNGTV,APT HPV,RFX16/18,45
Chlamydia, Nuc. Acid Amp: NEGATIVE
Gonococcus, Nuc. Acid Amp: NEGATIVE
HPV APTIMA: NEGATIVE
PAP SMEAR COMMENT: 0
TRICH VAG BY NAA: NEGATIVE

## 2016-05-11 LAB — VITAMIN D 25 HYDROXY (VIT D DEFICIENCY, FRACTURES): Vit D, 25-Hydroxy: 29.9 ng/mL — ABNORMAL LOW (ref 30.0–100.0)

## 2016-05-11 LAB — SPECIMEN STATUS REPORT

## 2017-05-24 ENCOUNTER — Encounter: Payer: Self-pay | Admitting: Obstetrics and Gynecology

## 2017-05-24 ENCOUNTER — Ambulatory Visit (INDEPENDENT_AMBULATORY_CARE_PROVIDER_SITE_OTHER): Payer: Managed Care, Other (non HMO) | Admitting: Obstetrics and Gynecology

## 2017-05-24 VITALS — BP 110/74 | HR 72 | Ht 64.0 in | Wt 146.0 lb

## 2017-05-24 DIAGNOSIS — Z113 Encounter for screening for infections with a predominantly sexual mode of transmission: Secondary | ICD-10-CM

## 2017-05-24 DIAGNOSIS — K625 Hemorrhage of anus and rectum: Secondary | ICD-10-CM | POA: Diagnosis not present

## 2017-05-24 DIAGNOSIS — Z124 Encounter for screening for malignant neoplasm of cervix: Secondary | ICD-10-CM

## 2017-05-24 DIAGNOSIS — Z131 Encounter for screening for diabetes mellitus: Secondary | ICD-10-CM

## 2017-05-24 DIAGNOSIS — Z1231 Encounter for screening mammogram for malignant neoplasm of breast: Secondary | ICD-10-CM | POA: Diagnosis not present

## 2017-05-24 DIAGNOSIS — Z13220 Encounter for screening for lipoid disorders: Secondary | ICD-10-CM

## 2017-05-24 DIAGNOSIS — Z0000 Encounter for general adult medical examination without abnormal findings: Secondary | ICD-10-CM | POA: Diagnosis not present

## 2017-05-24 DIAGNOSIS — Z01419 Encounter for gynecological examination (general) (routine) without abnormal findings: Secondary | ICD-10-CM

## 2017-05-24 DIAGNOSIS — Z1239 Encounter for other screening for malignant neoplasm of breast: Secondary | ICD-10-CM

## 2017-05-24 DIAGNOSIS — L819 Disorder of pigmentation, unspecified: Secondary | ICD-10-CM

## 2017-05-24 DIAGNOSIS — Z1321 Encounter for screening for nutritional disorder: Secondary | ICD-10-CM

## 2017-05-24 NOTE — Progress Notes (Signed)
Chief Complaint  Patient presents with  . Gynecologic Exam    Itching in breast and slight discoloration     HPI:      [name] is a 42 y.o. G2P0011 who LMP was Patient's last menstrual period was 05/03/2017 (exact date)., presents today for her annual examination. Her menses are regular every 28-30 days, lasting 5 days.  Dysmenorrhea none. She does not have intermenstrual bleeding.  Sex activity: not sexually active.  Last Pap:  05/03/16  Results were: no abnormalities /neg HPV DNA . Likes yearly paps. Hx of STDs: HPV. She would like full STD testing.   Mammo: 11/12/15 Cat 1. Done due to LT breast pain, possible mass. Sx resolved.  There is no FH of breast cancer. There is no FH of ovarian cancer. The patient does do self-breast exams. She has had LT breast itching and discoloration under her LT breast. Sx for about a yr and intermittent. Dries bras with dryer sheets. No masses although LT breast is fuller to pt and she would like to go ahead with yearly mammos.  Tobacco use: The patient denies current or previous tobacco use. Alcohol use: none Exercise: moderately active  She does get adequate calcium and Vitamin D in her diet.  She had normal labs/lipid 2017 and 2018. She  has a hx of pre-DM and would like HgA1C done today. She is not fasting. Likes yearly labs.   She complains BRBPR with occas BMs. Sx occur a couple times a month since last yr. Sx not changed. Blood in toilet. She denies any hx of constipation/hard stools/diarrhea. Sx random and intermittent. Has ext hem.   Past Medical History:  Diagnosis Date  . Abnormal Pap smear   . Prediabetes   . Vitamin D deficiency     Past Surgical History:  Procedure Laterality Date  . BUNIONECTOMY     BOTH FEET  . CRYOTHERAPY      Family History  Problem Relation Age of Onset  . Hypertension Father   . Diabetes Paternal Grandmother   . Breast cancer Neg Hx     Social History   Socioeconomic History   . Marital status: Single    Spouse name: Not on file  . Number of children: Not on file  . Years of education: Not on file  . Highest education level: Not on file  Occupational History  . Not on file  Social Needs  . Financial resource strain: Not on file  . Food insecurity:    Worry: Not on file    Inability: Not on file  . Transportation needs:    Medical: Not on file    Non-medical: Not on file  Tobacco Use  . Smoking status: Never Smoker  . Smokeless tobacco: Never Used  Substance and Sexual Activity  . Alcohol use: Yes  . Drug use: No  . Sexual activity: Not Currently    Partners: Male    Birth control/protection: None  Lifestyle  . Physical activity:    Days per week: Not on file    Minutes per session: Not on file  . Stress: Not on file  Relationships  . Social connections:    Talks on phone: Not on file    Gets together: Not on file    Attends religious service: Not on file    Active member of club or organization: Not on file    Attends meetings of clubs or organizations: Not on file    Relationship status: Not  on file  . Intimate partner violence:    Fear of current or ex partner: Not on file    Emotionally abused: Not on file    Physically abused: Not on file    Forced sexual activity: Not on file  Other Topics Concern  . Not on file  Social History Narrative  . Not on file    Current Outpatient Medications on File Prior to Visit  Medication Sig Dispense Refill  . cholecalciferol (VITAMIN D) 1000 units tablet Take 1,000 Units by mouth daily.    . ferrous sulfate 325 (65 FE) MG EC tablet Take 325 mg by mouth 3 (three) times daily with meals.    . Multiple Vitamin (MULTIVITAMIN WITH MINERALS) TABS tablet Take 1 tablet by mouth daily.    . valACYclovir (VALTREX) 500 MG tablet Take 1 tablet (500 mg total) by mouth 2 (two) times daily. (Patient not taking: Reported on 05/24/2017) 120 tablet 3   No current facility-administered medications on file prior to  visit.       ROS:  Review of Systems  Constitutional: Negative for fatigue, fever and unexpected weight change.  Respiratory: Negative for cough, shortness of breath and wheezing.   Cardiovascular: Negative for chest pain, palpitations and leg swelling.  Gastrointestinal: Negative for blood in stool, constipation, diarrhea, nausea and vomiting.  Endocrine: Negative for cold intolerance, heat intolerance and polyuria.  Genitourinary: Negative for dyspareunia, dysuria, flank pain, frequency, genital sores, hematuria, menstrual problem, pelvic pain, urgency, vaginal bleeding, vaginal discharge and vaginal pain.  Musculoskeletal: Negative for back pain, joint swelling and myalgias.  Skin: Negative for rash.  Neurological: Negative for dizziness, syncope, light-headedness, numbness and headaches.  Hematological: Negative for adenopathy.  Psychiatric/Behavioral: Negative for agitation, confusion, sleep disturbance and suicidal ideas. The patient is not nervous/anxious.      Objective: BP 110/74   Pulse 72   Ht 5\' 4"  (1.626 m)   Wt 146 lb (66.2 kg)   LMP 05/03/2017 (Exact Date)   BMI 25.06 kg/m    Physical Exam  Constitutional: She is oriented to person, place, and time. She appears well-developed and well-nourished.  Genitourinary: Vagina normal and uterus normal. There is no rash or tenderness on the right labia. There is no rash or tenderness on the left labia. No erythema or tenderness in the vagina. No vaginal discharge found. Right adnexum does not display mass and does not display tenderness. Left adnexum does not display mass and does not display tenderness. Cervix does not exhibit motion tenderness or polyp. Uterus is not enlarged or tender.  Neck: Normal range of motion. No thyromegaly present.  Cardiovascular: Normal rate, regular rhythm and normal heart sounds.  No murmur heard. Pulmonary/Chest: Effort normal and breath sounds normal. Right breast exhibits no mass, no  nipple discharge, no skin change and no tenderness. Left breast exhibits no mass, no nipple discharge, no skin change and no tenderness.  NEG BREAST EXAM; NO LESIONS, ERYTHEMA, RASH, MASSES BILAT. HYPERPIGMENTATION UNDER BILAT BREASTS  Abdominal: Soft. There is no tenderness. There is no guarding.  Musculoskeletal: Normal range of motion.  Neurological: She is alert and oriented to person, place, and time. No cranial nerve deficit.  Psychiatric: She has a normal mood and affect. Her behavior is normal.  Vitals reviewed.   Assessment/Plan: Encounter for annual routine gynecological examination  Cervical cancer screening - Pt pref.  - Plan: IGP,CtNgTv,rfx Aptima HPV ASCU, CANCELED: Pap IG (Image Guided)  Screening for STD (sexually transmitted disease) - Plan: IGP,CtNgTv,rfx Aptima  HPV ASCU, HIV antibody, RPR, Hepatitis C antibody, HSV 2 antibody, IgG  Rectal bleeding - Sx persist. Refer to GI for further eval. - Plan: Ambulatory referral to Gastroenterology  Screening for breast cancer - Pt wants to start mammos now. Neg breast exam. - Plan: MM 3D SCREEN BREAST BILATERAL  Blood tests for routine general physical examination - Plan: Comprehensive metabolic panel, Lipid panel, VITAMIN D 25 Hydroxy (Vit-D Deficiency, Fractures), TSH, Hemoglobin A1c  Screening cholesterol level - Plan: Lipid panel  Encounter for vitamin deficiency screening - Plan: VITAMIN D 25 Hydroxy (Vit-D Deficiency, Fractures), Hemoglobin A1c  Screening for diabetes mellitus  Discoloration of skin - Under bilat breasts, with itch LT breast. Reassurance. Question chemical--Double rinse bras/line dry. See if helps sx. No evid of dermatitis/tinea.  GYN counsel breast self exam, mammography screening, adequate intake of calcium and vitamin D, diet and exercise     F/U  Return in about 1 year (around 05/25/2018).  Jahnae Mcadoo B. Wilian Kwong, PA-C 05/24/2017 12:20 PM

## 2017-05-24 NOTE — Patient Instructions (Signed)
I value your feedback and entrusting us with your care. If you get a [facility] patient survey, I would appreciate you taking the time to let us know about your experience today. Thank you! 

## 2017-05-25 ENCOUNTER — Encounter: Payer: Self-pay | Admitting: Gastroenterology

## 2017-05-26 ENCOUNTER — Other Ambulatory Visit: Payer: Managed Care, Other (non HMO)

## 2017-05-26 DIAGNOSIS — Z0000 Encounter for general adult medical examination without abnormal findings: Secondary | ICD-10-CM

## 2017-05-26 DIAGNOSIS — Z13220 Encounter for screening for lipoid disorders: Secondary | ICD-10-CM

## 2017-05-26 DIAGNOSIS — Z1321 Encounter for screening for nutritional disorder: Secondary | ICD-10-CM

## 2017-05-26 DIAGNOSIS — Z113 Encounter for screening for infections with a predominantly sexual mode of transmission: Secondary | ICD-10-CM

## 2017-05-26 LAB — IGP,CTNGTV,RFX APTIMA HPV ASCU
CHLAMYDIA, NUC. ACID AMP: NEGATIVE
Gonococcus, Nuc. Acid Amp: NEGATIVE
PAP SMEAR COMMENT: 0
Trich vag by NAA: NEGATIVE

## 2017-05-27 LAB — LIPID PANEL
Chol/HDL Ratio: 2.3 ratio (ref 0.0–4.4)
Cholesterol, Total: 183 mg/dL (ref 100–199)
HDL: 81 mg/dL (ref >39)
LDL Calculated: 95 mg/dL (ref 0–99)
Triglycerides: 37 mg/dL (ref 0–149)
VLDL Cholesterol Cal: 7 mg/dL (ref 5–40)

## 2017-05-27 LAB — HSV 2 ANTIBODY, IGG: HSV 2 IgG, Type Spec: <0.91 index (ref 0.00–0.90)

## 2017-05-27 LAB — COMPREHENSIVE METABOLIC PANEL
ALBUMIN: 4.3 g/dL (ref 3.5–5.5)
ALT: 9 IU/L (ref 0–32)
AST: 13 IU/L (ref 0–40)
Albumin/Globulin Ratio: 1.4 (ref 1.2–2.2)
Alkaline Phosphatase: 49 IU/L (ref 39–117)
BUN/Creatinine Ratio: 8 — ABNORMAL LOW (ref 9–23)
BUN: 6 mg/dL (ref 6–20)
Bilirubin Total: 0.5 mg/dL (ref 0.0–1.2)
CALCIUM: 9.1 mg/dL (ref 8.7–10.2)
CO2: 24 mmol/L (ref 20–29)
CREATININE: 0.72 mg/dL (ref 0.57–1.00)
Chloride: 101 mmol/L (ref 96–106)
GFR, EST AFRICAN AMERICAN: 122 mL/min/{1.73_m2} (ref >59)
GFR, EST NON AFRICAN AMERICAN: 106 mL/min/{1.73_m2} (ref >59)
GLUCOSE: 80 mg/dL (ref 65–99)
Globulin, Total: 3.1 g/dL (ref 1.5–4.5)
Potassium: 4.2 mmol/L (ref 3.5–5.2)
Sodium: 138 mmol/L (ref 134–144)
TOTAL PROTEIN: 7.4 g/dL (ref 6.0–8.5)

## 2017-05-27 LAB — VITAMIN D 25 HYDROXY (VIT D DEFICIENCY, FRACTURES): VIT D 25 HYDROXY: 34.1 ng/mL (ref 30.0–100.0)

## 2017-05-27 LAB — HEMOGLOBIN A1C
Est. average glucose Bld gHb Est-mCnc: 114 mg/dL
Hgb A1c MFr Bld: 5.6 % (ref 4.8–5.6)

## 2017-05-27 LAB — RPR: RPR: NONREACTIVE

## 2017-05-27 LAB — TSH: TSH: 1.72 u[IU]/mL (ref 0.450–4.500)

## 2017-05-27 LAB — HIV ANTIBODY (ROUTINE TESTING W REFLEX): HIV SCREEN 4TH GENERATION: NONREACTIVE

## 2017-05-27 LAB — HEPATITIS C ANTIBODY

## 2017-05-28 ENCOUNTER — Encounter: Payer: Self-pay | Admitting: Obstetrics and Gynecology

## 2018-01-02 ENCOUNTER — Ambulatory Visit
Admission: RE | Admit: 2018-01-02 | Discharge: 2018-01-02 | Disposition: A | Discharge status: Home / Self Care | Payer: Self-pay | Source: Ambulatory Visit | Attending: Obstetrics and Gynecology | Admitting: Obstetrics and Gynecology

## 2018-01-02 DIAGNOSIS — Z1239 Encounter for other screening for malignant neoplasm of breast: Secondary | ICD-10-CM | POA: Insufficient documentation

## 2018-01-03 ENCOUNTER — Encounter: Payer: Self-pay | Admitting: Obstetrics and Gynecology

## 2018-01-22 ENCOUNTER — Telehealth: Payer: Self-pay

## 2018-01-22 NOTE — Telephone Encounter (Signed)
Pt calling triage line, she needs the dx code for the genetic testing for cancer. 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 She needs to make sure with her insurance company that it will be covered.

## 2018-01-22 NOTE — Telephone Encounter (Signed)
Please advise 

## 2018-01-23 NOTE — Telephone Encounter (Signed)
Called and [facility].

## 2018-01-23 NOTE — Telephone Encounter (Signed)
Pt having an internal itch in bilat breats for the past 2 months. Uses dryer sheets on bras but doesn't seem ext. Also has patches of bruising on breasts but isn't scratching. No masses, neg mammo 11/19. No caffeine intake. No FH breast cancer. Discussed Her 2 neu, ER/PR/triple neg breast cancers as well as inflammatory breast cancer. Offered appt for breast exam but pt wants to follow for now. Pt doesn't qualify for cancer genetic testing. F/u prn.

## 2018-01-23 NOTE — Telephone Encounter (Signed)
You have to meet criteria for cancer genetic testing. What is the FH of cancer in her family? Can't be done just for screening, whether LC or not.

## 2018-01-23 NOTE — Telephone Encounter (Signed)
Pt calling for the codes or name of the genetic testing she wants to have drawn thru [facility] she wants the BRCA 2 and HER 2.  Pt is Armed forces logistics/support/administrative officer.  [phone]

## 2018-01-23 NOTE — Telephone Encounter (Signed)
I called pt to give ABC's last msg, she says there is no immediate FH of cancer. She says she might be overthinking, her last 2 cycles she has had a lot of itchiness on her breasts and has been easily bruising under breasts for the last week. Please look at Cheryl Macias's notes before me.

## 2018-02-01 ENCOUNTER — Ambulatory Visit: Payer: Managed Care, Other (non HMO) | Admitting: Podiatry

## 2018-02-01 ENCOUNTER — Encounter: Payer: Self-pay | Admitting: Podiatry

## 2018-02-01 ENCOUNTER — Ambulatory Visit (INDEPENDENT_AMBULATORY_CARE_PROVIDER_SITE_OTHER): Payer: Managed Care, Other (non HMO)

## 2018-02-01 ENCOUNTER — Other Ambulatory Visit: Payer: Self-pay | Admitting: Podiatry

## 2018-02-01 DIAGNOSIS — M1710 Unilateral primary osteoarthritis, unspecified knee: Secondary | ICD-10-CM | POA: Insufficient documentation

## 2018-02-01 DIAGNOSIS — M79672 Pain in left foot: Secondary | ICD-10-CM

## 2018-02-01 DIAGNOSIS — M2012 Hallux valgus (acquired), left foot: Secondary | ICD-10-CM | POA: Diagnosis not present

## 2018-02-01 DIAGNOSIS — M179 Osteoarthritis of knee, unspecified: Secondary | ICD-10-CM | POA: Insufficient documentation

## 2018-02-01 MED ORDER — FLUCONAZOLE 150 MG PO TABS: 150.0000 mg | ORAL_TABLET | Freq: Once | ORAL | 0 refills | Status: AC

## 2018-02-03 NOTE — Progress Notes (Signed)
Subjective:   Patient ID: Cheryl Macias, female   DOB: 42 y.o.   MRN: [id number]   HPI Patient presents stating she is been getting itching in her second toe and she had bunion surgery several years ago which is doing very well.  Cannot tell if it might be a nerve issue and it currently its not creating a lot of problems but she wanted to get it checked   ROS      Objective:  Physical Exam  Neurovascular status intact with patient found to have no changes when I inspected the second digit with no skin irritation noted currently but it appears to be something that is creating some kind of a itching type complex     Assessment:  Difficult to make complete determination as to why she may having trouble with this but it appears to be a localized process     Plan:  Reviewed condition and x-ray and at this point we will need to put her on 1 diclofenac a week for 6 weeks and see if this gets it better along with soaks and Benadryl as needed.  Patient will be seen back if symptoms persist or get worse  X-rays were negative for any pathology with first metatarsal doing very well from previous bunion surgery with fixation in place

## 2018-10-14 NOTE — Progress Notes (Signed)
Chief Complaint  Patient presents with  . Gynecologic Exam    HPI:      [name] is a 43 y.o. G2P0011 who LMP was Patient's last menstrual period was 10/05/2018 (exact date)., presents today for her annual examination.  Her menses are regular every 28-30 days, lasting 5-7 days.  Dysmenorrhea none. She does not have intermenstrual bleeding.  Sex activity: not sexually active.  Last Pap:  05/24/17  Results were: no abnormalities /neg HPV DNA 05/2016. Likes yearly paps. Hx of STDs: HPV. Neg full STD testing 2019, declines testing this yr.  Mammo: 01/02/18 Cat 1. Done due to LT breast pain, possible mass. Sx resolved. Repeat due this yr. There is no FH of breast cancer. There is no FH of ovarian cancer. The patient does do self-breast exams.   Tobacco use: The patient denies current or previous tobacco use. Alcohol use: none Drug use: none Exercise: moderately active  She does get adequate calcium and Vitamin D in her diet.  She had normal labs/lipid 2017 and 2018. She  has a hx of pre-DM. Likes yearly labs.   She complains BRBPR with occas BMs. Sx occurred a couple times a month last yr but less frequently this yr. Never saw GI last yr. She sometimes has constipation. Has ext hem. No FH colon cancer.   Past Medical History:  Diagnosis Date  . Abnormal Pap smear   . Prediabetes   . Vitamin D deficiency     Past Surgical History:  Procedure Laterality Date  . BUNIONECTOMY     BOTH FEET  . CRYOTHERAPY      Family History  Problem Relation Age of Onset  . Hypertension Father   . Diabetes Paternal Grandmother   . Breast cancer Neg Hx     Social History   Socioeconomic History  . Marital status: Single    Spouse name: Not on file  . Number of children: Not on file  . Years of education: Not on file  . Highest education level: Not on file  Occupational History  . Not on file  Social Needs  . Financial resource strain: Not on file  . Food insecurity     Worry: Not on file    Inability: Not on file  . Transportation needs    Medical: Not on file    Non-medical: Not on file  Tobacco Use  . Smoking status: Never Smoker  . Smokeless tobacco: Never Used  Substance and Sexual Activity  . Alcohol use: Yes  . Drug use: No  . Sexual activity: Not Currently    Partners: Male    Birth control/protection: None  Lifestyle  . Physical activity    Days per week: Not on file    Minutes per session: Not on file  . Stress: Not on file  Relationships  . Social Herbalist on phone: Not on file    Gets together: Not on file    Attends religious service: Not on file    Active member of club or organization: Not on file    Attends meetings of clubs or organizations: Not on file    Relationship status: Not on file  . Intimate partner violence    Fear of current or ex partner: Not on file    Emotionally abused: Not on file    Physically abused: Not on file    Forced sexual activity: Not on file  Other Topics Concern  . Not on file  Social History Narrative  . Not on file    Current Outpatient Medications on File Prior to Visit  Medication Sig Dispense Refill  . cholecalciferol (VITAMIN D) 1000 units tablet Take 1,000 Units by mouth daily.    . ferrous sulfate 325 (65 FE) MG EC tablet Take 325 mg by mouth 3 (three) times daily with meals.    . valACYclovir (VALTREX) 500 MG tablet Take 1 tablet (500 mg total) by mouth 2 (two) times daily. 120 tablet 3   No current facility-administered medications on file prior to visit.       ROS:  Review of Systems  Constitutional: Negative for fatigue, fever and unexpected weight change.  Respiratory: Negative for cough, shortness of breath and wheezing.   Cardiovascular: Negative for chest pain, palpitations and leg swelling.  Gastrointestinal: Positive for blood in stool. Negative for constipation, diarrhea, nausea and vomiting.  Endocrine: Negative for cold intolerance, heat  intolerance and polyuria.  Genitourinary: Negative for dyspareunia, dysuria, flank pain, frequency, genital sores, hematuria, menstrual problem, pelvic pain, urgency, vaginal bleeding, vaginal discharge and vaginal pain.  Musculoskeletal: Positive for arthralgias. Negative for back pain, joint swelling and myalgias.  Skin: Negative for rash.  Neurological: Negative for dizziness, syncope, light-headedness, numbness and headaches.  Hematological: Negative for adenopathy.  Psychiatric/Behavioral: Negative for agitation, confusion, sleep disturbance and suicidal ideas. The patient is not nervous/anxious.      Objective: BP 100/70   Ht 5\' 4"  (1.626 m)   Wt 139 lb (63 kg)   LMP 10/05/2018 (Exact Date)   BMI 23.86 kg/m    Physical Exam Constitutional:      Appearance: She is well-developed.  Genitourinary:     Vulva, vagina, uterus, right adnexa and left adnexa normal.     No vulval lesion or tenderness noted.     No vaginal discharge, erythema or tenderness.     No cervical motion tenderness or polyp.     Uterus is not enlarged or tender.     No right or left adnexal mass present.     Right adnexa not tender.     Left adnexa not tender.  Rectum:     Guaiac result negative.     External hemorrhoid present.  Neck:     Musculoskeletal: Normal range of motion.     Thyroid: No thyromegaly.  Cardiovascular:     Rate and Rhythm: Normal rate and regular rhythm.     Heart sounds: Normal heart sounds. No murmur.  Pulmonary:     Effort: Pulmonary effort is normal.     Breath sounds: Normal breath sounds.  Chest:     Breasts:        Right: No mass, nipple discharge, skin change or tenderness.        Left: No mass, nipple discharge, skin change or tenderness.  Abdominal:     Palpations: Abdomen is soft.     Tenderness: There is no abdominal tenderness. There is no guarding.  Musculoskeletal: Normal range of motion.  Neurological:     General: No focal deficit present.     Mental  Status: She is alert and oriented to person, place, and time.     Cranial Nerves: No cranial nerve deficit.  Skin:    General: Skin is warm and dry.  Psychiatric:        Mood and Affect: Mood normal.        Behavior: Behavior normal.        Thought Content: Thought content normal.  Judgment: Judgment normal.  Vitals signs reviewed.     Assessment/Plan: Encounter for annual routine gynecological examination  Cervical cancer screening - Plan: IGP, rfx Aptima HPV ASCU  Screening for breast cancer - Plan: MM 3D SCREEN BREAST BILATERAL; pt to sched mammo  Blood tests for routine general physical examination - Plan: Comprehensive metabolic panel, Lipid panel, VITAMIN D 25 Hydroxy (Vit-D Deficiency, Fractures), Hemoglobin A1c  Screening cholesterol level - Plan: Lipid panel  Encounter for vitamin deficiency screening - Plan: VITAMIN D 25 Hydroxy (Vit-D Deficiency, Fractures)  Screening for diabetes mellitus - Plan: Hemoglobin A1c  Rectal bleeding - Plan: POCT Occult Blood Stool, Ambulatory referral to Gastroenterology; Hx of ext hem, sx most likely due to hemorrhoid but worth GI ref for further eval.   GYN counsel breast self exam, mammography screening, adequate intake of calcium and vitamin D, diet and exercise     F/U  Return in about 1 year (around 10/15/2019).  Jakya Dovidio B. Lila L[place]kin, PA-C 10/15/2018 10:57 AM

## 2018-10-15 ENCOUNTER — Other Ambulatory Visit: Payer: Self-pay

## 2018-10-15 ENCOUNTER — Ambulatory Visit (INDEPENDENT_AMBULATORY_CARE_PROVIDER_SITE_OTHER): Payer: Managed Care, Other (non HMO) | Admitting: Obstetrics and Gynecology

## 2018-10-15 ENCOUNTER — Encounter: Payer: Self-pay | Admitting: Obstetrics and Gynecology

## 2018-10-15 VITALS — BP 100/70 | Ht 64.0 in | Wt 139.0 lb

## 2018-10-15 DIAGNOSIS — Z1239 Encounter for other screening for malignant neoplasm of breast: Secondary | ICD-10-CM

## 2018-10-15 DIAGNOSIS — Z0000 Encounter for general adult medical examination without abnormal findings: Secondary | ICD-10-CM

## 2018-10-15 DIAGNOSIS — Z124 Encounter for screening for malignant neoplasm of cervix: Secondary | ICD-10-CM

## 2018-10-15 DIAGNOSIS — Z1321 Encounter for screening for nutritional disorder: Secondary | ICD-10-CM

## 2018-10-15 DIAGNOSIS — K625 Hemorrhage of anus and rectum: Secondary | ICD-10-CM

## 2018-10-15 DIAGNOSIS — Z01419 Encounter for gynecological examination (general) (routine) without abnormal findings: Secondary | ICD-10-CM | POA: Diagnosis not present

## 2018-10-15 DIAGNOSIS — Z13220 Encounter for screening for lipoid disorders: Secondary | ICD-10-CM

## 2018-10-15 DIAGNOSIS — Z131 Encounter for screening for diabetes mellitus: Secondary | ICD-10-CM

## 2018-10-15 LAB — HEMOCCULT GUIAC POC 1CARD (OFFICE): Fecal Occult Blood, POC: NEGATIVE

## 2018-10-15 NOTE — Patient Instructions (Signed)
I value your feedback and entrusting us with your care. If you get a [facility] patient survey, I would appreciate you taking the time to let us know about your experience today. Thank you! 

## 2018-10-16 LAB — COMPREHENSIVE METABOLIC PANEL
ALT: 9 IU/L (ref 0–32)
AST: 17 IU/L (ref 0–40)
Albumin/Globulin Ratio: 1.4 (ref 1.2–2.2)
Albumin: 4 g/dL (ref 3.8–4.8)
Alkaline Phosphatase: 51 IU/L (ref 39–117)
BUN/Creatinine Ratio: 10 (ref 9–23)
BUN: 8 mg/dL (ref 6–24)
Bilirubin Total: 0.5 mg/dL (ref 0.0–1.2)
CO2: 23 mmol/L (ref 20–29)
Calcium: 9.4 mg/dL (ref 8.7–10.2)
Chloride: 102 mmol/L (ref 96–106)
Creatinine, Ser: 0.8 mg/dL (ref 0.57–1.00)
GFR calc Af Amer: 107 mL/min/{1.73_m2} (ref >59)
GFR calc non Af Amer: 93 mL/min/{1.73_m2} (ref >59)
Globulin, Total: 2.8 g/dL (ref 1.5–4.5)
Glucose: 79 mg/dL (ref 65–99)
Potassium: 4.2 mmol/L (ref 3.5–5.2)
Sodium: 139 mmol/L (ref 134–144)
Total Protein: 6.8 g/dL (ref 6.0–8.5)

## 2018-10-16 LAB — IGP, RFX APTIMA HPV ASCU

## 2018-10-16 LAB — HEMOGLOBIN A1C
Est. average glucose Bld gHb Est-mCnc: 105 mg/dL
Hgb A1c MFr Bld: 5.3 % (ref 4.8–5.6)

## 2018-10-16 LAB — LIPID PANEL
Chol/HDL Ratio: 2.2 ratio (ref 0.0–4.4)
Cholesterol, Total: 196 mg/dL (ref 100–199)
HDL: 88 mg/dL (ref >39)
LDL Chol Calc (NIH): 102 mg/dL — ABNORMAL HIGH (ref 0–99)
Triglycerides: 27 mg/dL (ref 0–149)
VLDL Cholesterol Cal: 6 mg/dL (ref 5–40)

## 2018-10-16 LAB — VITAMIN D 25 HYDROXY (VIT D DEFICIENCY, FRACTURES): Vit D, 25-Hydroxy: 33.7 ng/mL (ref 30.0–100.0)

## 2018-10-16 NOTE — Progress Notes (Signed)
Pls notify pt of normal labs. Thx

## 2018-10-16 NOTE — Progress Notes (Signed)
Called pt, no answer, left detailed msg

## 2018-10-19 ENCOUNTER — Encounter: Payer: Self-pay | Admitting: *Deleted

## 2019-01-07 ENCOUNTER — Ambulatory Visit
Admission: RE | Admit: 2019-01-07 | Discharge: 2019-01-07 | Disposition: A | Discharge status: Home / Self Care | Payer: Managed Care, Other (non HMO) | Source: Ambulatory Visit | Attending: Obstetrics and Gynecology | Admitting: Obstetrics and Gynecology

## 2019-01-07 ENCOUNTER — Encounter: Payer: Self-pay | Admitting: Obstetrics and Gynecology

## 2019-01-07 DIAGNOSIS — Z1239 Encounter for other screening for malignant neoplasm of breast: Secondary | ICD-10-CM

## 2019-01-07 DIAGNOSIS — Z1231 Encounter for screening mammogram for malignant neoplasm of breast: Secondary | ICD-10-CM | POA: Insufficient documentation

## 2019-04-10 ENCOUNTER — Telehealth: Payer: Self-pay

## 2019-04-10 DIAGNOSIS — Z113 Encounter for screening for infections with a predominantly sexual mode of transmission: Secondary | ICD-10-CM

## 2019-04-10 NOTE — Telephone Encounter (Signed)
Pt is calling requesting her lab order be put in before her annual . She is [employer] employee. She is wanting all the basic lab work and std's screening.

## 2019-04-11 ENCOUNTER — Other Ambulatory Visit: Payer: Self-pay | Admitting: Obstetrics and Gynecology

## 2019-04-11 DIAGNOSIS — Z13220 Encounter for screening for lipoid disorders: Secondary | ICD-10-CM

## 2019-04-11 DIAGNOSIS — Z1321 Encounter for screening for nutritional disorder: Secondary | ICD-10-CM

## 2019-04-11 DIAGNOSIS — Z0000 Encounter for general adult medical examination without abnormal findings: Secondary | ICD-10-CM

## 2019-04-11 DIAGNOSIS — Z131 Encounter for screening for diabetes mellitus: Secondary | ICD-10-CM

## 2019-04-11 NOTE — Progress Notes (Signed)
Lab orders before annual, per pt request.

## 2019-04-11 NOTE — Telephone Encounter (Signed)
Lab orders in system. Must be fasting. Can sched lab appt closer to her annual. Last done 8/20 so doesn't need before then.

## 2019-04-12 NOTE — Telephone Encounter (Signed)
Pt does not want the vaginal std testing. Pt wants HIV blood work. Can you put order in so she can have it drawn or does she need to be seen?

## 2019-04-12 NOTE — Telephone Encounter (Signed)
Pt aware and transferred to [facility] to schedule lab appt.

## 2019-04-12 NOTE — Telephone Encounter (Signed)
HIV order placed. Can get drawn anytime. Pls notify pt.  Cheryl Macias

## 2019-04-12 NOTE — Telephone Encounter (Signed)
Pt aware and transferred to [facility] for STD testing. She also asked how often can she have std testing in regards to insurance coverage-advised to call insurance.

## 2019-04-22 ENCOUNTER — Other Ambulatory Visit: Payer: Self-pay

## 2019-04-22 ENCOUNTER — Other Ambulatory Visit: Payer: Self-pay | Admitting: Obstetrics and Gynecology

## 2019-04-22 ENCOUNTER — Other Ambulatory Visit: Payer: Managed Care, Other (non HMO)

## 2019-04-22 DIAGNOSIS — Z131 Encounter for screening for diabetes mellitus: Secondary | ICD-10-CM

## 2019-04-22 DIAGNOSIS — Z13220 Encounter for screening for lipoid disorders: Secondary | ICD-10-CM

## 2019-04-22 DIAGNOSIS — Z113 Encounter for screening for infections with a predominantly sexual mode of transmission: Secondary | ICD-10-CM

## 2019-04-22 DIAGNOSIS — Z1321 Encounter for screening for nutritional disorder: Secondary | ICD-10-CM

## 2019-04-22 DIAGNOSIS — Z0000 Encounter for general adult medical examination without abnormal findings: Secondary | ICD-10-CM

## 2019-04-22 NOTE — Progress Notes (Signed)
hi

## 2019-04-23 LAB — HIV ANTIBODY (ROUTINE TESTING W REFLEX): HIV Screen 4th Generation wRfx: NONREACTIVE

## 2019-06-24 ENCOUNTER — Other Ambulatory Visit: Payer: Self-pay | Admitting: Obstetrics and Gynecology

## 2019-06-24 ENCOUNTER — Telehealth: Payer: Self-pay

## 2019-06-24 MED ORDER — MICROGESTIN 24 FE 1-20 MG-MCG PO TABS: 1.0000 | ORAL_TABLET | Freq: Every day | ORAL | 1 refills | Status: DC

## 2019-06-24 NOTE — Telephone Encounter (Signed)
Spoke w/patient. She is interested in OCP's.

## 2019-06-24 NOTE — Telephone Encounter (Signed)
Called pt, no answer, LVMTRC. 

## 2019-06-24 NOTE — Telephone Encounter (Signed)
Rx OCP sent to [facility]. Start first [place] of next menses. Condoms for 1 mo. May have BTB for 2-3 cycles. F/u prn.

## 2019-06-24 NOTE — Telephone Encounter (Signed)
Patient inquiring if she needs apt to get a birth control rx. [phone]

## 2019-06-24 NOTE — Progress Notes (Signed)
OCP start with next menses. Has annual 9/21.

## 2019-06-25 NOTE — Telephone Encounter (Signed)
Called pt again, no luck, LVMTRC.

## 2019-06-25 NOTE — Telephone Encounter (Signed)
Pt is returning call. LVM relaying ABC's message

## 2019-06-25 NOTE — Telephone Encounter (Signed)
What does she take valtrex for? Is it daily or as needed? Will send in RF (last one in chart was 2014 by a non-[facility] provider). Thx.

## 2019-06-26 ENCOUNTER — Other Ambulatory Visit: Payer: Self-pay | Admitting: Obstetrics and Gynecology

## 2019-06-26 DIAGNOSIS — B009 Herpesviral infection, unspecified: Secondary | ICD-10-CM

## 2019-06-26 MED ORDER — VALACYCLOVIR HCL 1 G PO TABS: 1000.0000 mg | ORAL_TABLET | Freq: Every day | ORAL | 0 refills | Status: DC

## 2019-06-26 NOTE — Telephone Encounter (Signed)
Pt aware.

## 2019-06-26 NOTE — Progress Notes (Signed)
Rx valtrex 1 g daily for oral herpes prevention. Sent to [facility]. Has upcoming annual

## 2019-06-26 NOTE — Telephone Encounter (Signed)
Herpes 1, daily.

## 2019-06-26 NOTE — Telephone Encounter (Signed)
Patient returning call. [phone]

## 2019-06-26 NOTE — Telephone Encounter (Signed)
Rx eRxd to [facility]

## 2019-08-27 ENCOUNTER — Other Ambulatory Visit: Payer: Self-pay | Admitting: Obstetrics and Gynecology

## 2019-08-27 DIAGNOSIS — B009 Herpesviral infection, unspecified: Secondary | ICD-10-CM

## 2019-08-27 NOTE — Telephone Encounter (Signed)
Annual scheduled for 9/1.

## 2019-10-15 NOTE — Progress Notes (Signed)
Chief Complaint  Patient presents with   Gynecologic Exam   [employer] Employee    HPI:      [name] is a 44 y.o. G2P0011 who LMP was Patient's last menstrual period was 10/05/2019 (exact date)., presents today for her annual examination.  Her menses are regular every 28-30 days, lasting 5-7 days. Dysmenorrhea none. She does not have intermenstrual bleeding.  Sex activity: currently sexually active--contraception none. Declines BC.  Last Pap:  10/15/18  Results were: no abnormalities /neg HPV DNA 05/2016. Likes yearly paps. Hx of STDs: HPV with cryotherapy. Neg full STD testing 2019, declines testing this yr.  Mammo: 01/07/19 Results were normal, repeat in 12 months.  There is no FH of breast cancer. There is no FH of ovarian cancer. The patient does self-breast exams.   Tobacco use: The patient denies current or previous tobacco use. Alcohol use: none Drug use: none Exercise: moderately active  She does get adequate calcium and Vitamin D in her diet.  She had normal labs/lipid 2017 and 2018. She  has a hx of pre-DM. Likes yearly labs.   Takes valtrex prn for cold sores, doesn't need Rx RF  Rectal bleeding resolved. No FH colon cancer. Never saw GI.  Past Medical History:  Diagnosis Date   Abnormal Pap smear    Prediabetes    Vitamin D deficiency     Past Surgical History:  Procedure Laterality Date   BUNIONECTOMY     BOTH FEET   CRYOTHERAPY      Family History  Problem Relation Age of Onset   Hypertension Father    Diabetes Paternal Grandmother    Breast cancer Neg Hx     Social History   Socioeconomic History   Marital status: Single    Spouse name: Not on file   Number of children: Not on file   Years of education: Not on file   Highest education level: Not on file  Occupational History   Not on file  Tobacco Use   Smoking status: Never Smoker   Smokeless tobacco: Never Used  Vaping Use   Vaping Use: Never used   Substance and Sexual Activity   Alcohol use: Yes   Drug use: No   Sexual activity: Yes    Partners: Male    Birth control/protection: None  Other Topics Concern   Not on file  Social History Narrative   Not on file   Social Determinants of Health   Financial Resource Strain:    Difficulty of Paying Living Expenses: Not on file  Food Insecurity:    Worried About Charity fundraiser in the Last Year: Not on file   [employer] of Food in the Last Year: Not on file  Transportation Needs:    Lack of Transportation (Medical): Not on file   Lack of Transportation (Non-Medical): Not on file  Physical Activity:    Days of Exercise per Week: Not on file   Minutes of Exercise per Session: Not on file  Stress:    Feeling of Stress : Not on file  Social Connections:    Frequency of Communication with Friends and Family: Not on file   Frequency of Social Gatherings with Friends and Family: Not on file   Attends Religious Services: Not on file   Active Member of Clubs or Organizations: Not on file   Attends Archivist Meetings: Not on file   Marital Status: Not on file  Intimate Partner Violence:  Fear of Current or Ex-Partner: Not on file   Emotionally Abused: Not on file   Physically Abused: Not on file   Sexually Abused: Not on file    Current Outpatient Medications on File Prior to Visit  Medication Sig Dispense Refill   cholecalciferol (VITAMIN D) 1000 units tablet Take 1,000 Units by mouth daily.     ferrous sulfate 325 (65 FE) MG EC tablet Take 325 mg by mouth 3 (three) times daily with meals.     valACYclovir (VALTREX) 1000 MG tablet Take 1 tablet (1,000 mg total) by mouth daily. 90 tablet 0   No current facility-administered medications on file prior to visit.      ROS:  Review of Systems  Constitutional: Negative for fatigue, fever and unexpected weight change.  Respiratory: Negative for cough, shortness of breath and wheezing.    Cardiovascular: Negative for chest pain, palpitations and leg swelling.  Gastrointestinal: Negative for blood in stool, constipation, diarrhea, nausea and vomiting.  Endocrine: Negative for cold intolerance, heat intolerance and polyuria.  Genitourinary: Negative for dyspareunia, dysuria, flank pain, frequency, genital sores, hematuria, menstrual problem, pelvic pain, urgency, vaginal bleeding, vaginal discharge and vaginal pain.  Musculoskeletal: Negative for arthralgias, back pain, joint swelling and myalgias.  Skin: Negative for rash.  Neurological: Negative for dizziness, syncope, light-headedness, numbness and headaches.  Hematological: Negative for adenopathy.  Psychiatric/Behavioral: Negative for agitation, confusion, sleep disturbance and suicidal ideas. The patient is not nervous/anxious.      Objective: BP 110/80    Ht 5\' 4"  (1.626 m)    Wt 144 lb (65.3 kg)    LMP 10/05/2019 (Exact Date)    BMI 24.72 kg/m    Physical Exam Constitutional:      Appearance: She is well-developed.  Genitourinary:     Vulva, vagina, uterus, right adnexa and left adnexa normal.     No vulval lesion or tenderness noted.     No vaginal discharge, erythema or tenderness.     No cervical motion tenderness or polyp.     Uterus is not enlarged or tender.     No right or left adnexal mass present.     Right adnexa not tender.     Left adnexa not tender.  Rectum:     Guaiac result negative.     External hemorrhoid present.  Neck:     Thyroid: No thyromegaly.  Cardiovascular:     Rate and Rhythm: Normal rate and regular rhythm.     Heart sounds: Normal heart sounds. No murmur heard.   Pulmonary:     Effort: Pulmonary effort is normal.     Breath sounds: Normal breath sounds.  Chest:     Breasts:        Right: No mass, nipple discharge, skin change or tenderness.        Left: No mass, nipple discharge, skin change or tenderness.  Abdominal:     Palpations: Abdomen is soft.     Tenderness:  There is no abdominal tenderness. There is no guarding.  Musculoskeletal:        General: Normal range of motion.     Cervical back: Normal range of motion.  Neurological:     General: No focal deficit present.     Mental Status: She is alert and oriented to person, place, and time.     Cranial Nerves: No cranial nerve deficit.  Skin:    General: Skin is warm and dry.  Psychiatric:  Mood and Affect: Mood normal.        Behavior: Behavior normal.        Thought Content: Thought content normal.        Judgment: Judgment normal.  Vitals reviewed.     Assessment/Plan: Encounter for annual routine gynecological examination  Cervical cancer screening - Plan: IGP,CtNgTv,rfx Aptima HPV ASCU,   Screening for STD (sexually transmitted disease) - Plan: HIV Antibody (routine testing w rflx), RPR, Hepatitis C antibody, IGP,CtNgTv,rfx Aptima HPV ASCU  Encounter for screening mammogram for malignant neoplasm of breast - Plan: MM 3D SCREEN BREAST BILATERAL; pt to sched mammo  Cold sore--will RF valtrex prn  Blood tests for routine general physical examination - Plan: Comprehensive metabolic panel, Lipid panel, Hemoglobin A1c  Screening cholesterol level - Plan: Lipid panel  Screening for diabetes mellitus - Plan: Hemoglobin A1c    GYN counsel breast self exam, mammography screening, adequate intake of calcium and vitamin D, diet and exercise     F/U  Return in about 1 year (around 10/15/2020).  Devonn Giampietro B. Sevilla Murtagh, PA-C 10/16/2019 8:28 AM

## 2019-10-16 ENCOUNTER — Ambulatory Visit (INDEPENDENT_AMBULATORY_CARE_PROVIDER_SITE_OTHER): Payer: Managed Care, Other (non HMO) | Admitting: Obstetrics and Gynecology

## 2019-10-16 ENCOUNTER — Encounter: Payer: Self-pay | Admitting: Obstetrics and Gynecology

## 2019-10-16 ENCOUNTER — Other Ambulatory Visit: Payer: Self-pay

## 2019-10-16 VITALS — BP 110/80 | Ht 64.0 in | Wt 144.0 lb

## 2019-10-16 DIAGNOSIS — Z01419 Encounter for gynecological examination (general) (routine) without abnormal findings: Secondary | ICD-10-CM

## 2019-10-16 DIAGNOSIS — Z113 Encounter for screening for infections with a predominantly sexual mode of transmission: Secondary | ICD-10-CM | POA: Diagnosis not present

## 2019-10-16 DIAGNOSIS — B001 Herpesviral vesicular dermatitis: Secondary | ICD-10-CM

## 2019-10-16 DIAGNOSIS — Z0000 Encounter for general adult medical examination without abnormal findings: Secondary | ICD-10-CM

## 2019-10-16 DIAGNOSIS — Z1231 Encounter for screening mammogram for malignant neoplasm of breast: Secondary | ICD-10-CM

## 2019-10-16 DIAGNOSIS — Z124 Encounter for screening for malignant neoplasm of cervix: Secondary | ICD-10-CM

## 2019-10-16 DIAGNOSIS — Z13220 Encounter for screening for lipoid disorders: Secondary | ICD-10-CM

## 2019-10-16 DIAGNOSIS — Z131 Encounter for screening for diabetes mellitus: Secondary | ICD-10-CM

## 2019-10-16 NOTE — Patient Instructions (Addendum)
I value your feedback and entrusting us with your care. If you get a  patient survey, I would appreciate you taking the time to let us know about your experience today. Thank you! ° °As of January 24, 2019, your lab results will be released to your MyChart immediately, before I even have a chance to see them. Please give me time to review them and contact you if there are any abnormalities. Thank you for your patience.  ° °[facility]: [phone] ° ° ° °

## 2019-10-18 LAB — LIPID PANEL
Chol/HDL Ratio: 2.1 ratio (ref 0.0–4.4)
Cholesterol, Total: 199 mg/dL (ref 100–199)
HDL: 96 mg/dL (ref >39)
LDL Chol Calc (NIH): 95 mg/dL (ref 0–99)
Triglycerides: 41 mg/dL (ref 0–149)
VLDL Cholesterol Cal: 8 mg/dL (ref 5–40)

## 2019-10-18 LAB — IGP,CTNGTV,RFX APTIMA HPV ASCU
Chlamydia, Nuc. Acid Amp: NEGATIVE
Gonococcus, Nuc. Acid Amp: NEGATIVE
Trich vag by NAA: NEGATIVE

## 2019-10-18 LAB — COMPREHENSIVE METABOLIC PANEL
ALT: 11 IU/L (ref 0–32)
AST: 14 IU/L (ref 0–40)
Albumin/Globulin Ratio: 1.2 (ref 1.2–2.2)
Albumin: 4.1 g/dL (ref 3.8–4.8)
Alkaline Phosphatase: 62 IU/L (ref 48–121)
BUN/Creatinine Ratio: 10 (ref 9–23)
BUN: 7 mg/dL (ref 6–24)
Bilirubin Total: 0.2 mg/dL (ref 0.0–1.2)
CO2: 27 mmol/L (ref 20–29)
Calcium: 9.2 mg/dL (ref 8.7–10.2)
Chloride: 101 mmol/L (ref 96–106)
Creatinine, Ser: 0.69 mg/dL (ref 0.57–1.00)
GFR calc Af Amer: 125 mL/min/{1.73_m2} (ref >59)
GFR calc non Af Amer: 108 mL/min/{1.73_m2} (ref >59)
Globulin, Total: 3.5 g/dL (ref 1.5–4.5)
Glucose: 85 mg/dL (ref 65–99)
Potassium: 4.5 mmol/L (ref 3.5–5.2)
Sodium: 138 mmol/L (ref 134–144)
Total Protein: 7.6 g/dL (ref 6.0–8.5)

## 2019-10-18 LAB — HEMOGLOBIN A1C
Est. average glucose Bld gHb Est-mCnc: 114 mg/dL
Hgb A1c MFr Bld: 5.6 % (ref 4.8–5.6)

## 2019-10-18 LAB — RPR: RPR Ser Ql: NONREACTIVE

## 2019-10-18 LAB — HEPATITIS C ANTIBODY: Hep C Virus Ab: <0.1 s/co ratio (ref 0.0–0.9)

## 2019-10-18 LAB — HIV ANTIBODY (ROUTINE TESTING W REFLEX): HIV Screen 4th Generation wRfx: NONREACTIVE

## 2019-12-27 ENCOUNTER — Other Ambulatory Visit: Payer: Self-pay

## 2019-12-27 ENCOUNTER — Encounter: Payer: Self-pay | Admitting: Advanced Practice Midwife

## 2019-12-27 ENCOUNTER — Ambulatory Visit (INDEPENDENT_AMBULATORY_CARE_PROVIDER_SITE_OTHER): Payer: Managed Care, Other (non HMO) | Admitting: Advanced Practice Midwife

## 2019-12-27 VITALS — BP 118/70 | Ht 64.0 in | Wt 150.2 lb

## 2019-12-27 DIAGNOSIS — Z3A08 8 weeks gestation of pregnancy: Secondary | ICD-10-CM

## 2019-12-27 DIAGNOSIS — O0991 Supervision of high risk pregnancy, unspecified, first trimester: Secondary | ICD-10-CM | POA: Insufficient documentation

## 2019-12-27 DIAGNOSIS — Z130 Encounter for screening for diseases of the blood and blood-forming organs and certain disorders involving the immune mechanism: Secondary | ICD-10-CM

## 2019-12-27 DIAGNOSIS — O09521 Supervision of elderly multigravida, first trimester: Secondary | ICD-10-CM | POA: Insufficient documentation

## 2019-12-27 DIAGNOSIS — O0990 Supervision of high risk pregnancy, unspecified, unspecified trimester: Secondary | ICD-10-CM | POA: Insufficient documentation

## 2019-12-27 DIAGNOSIS — O09529 Supervision of elderly multigravida, unspecified trimester: Secondary | ICD-10-CM | POA: Insufficient documentation

## 2019-12-27 DIAGNOSIS — N926 Irregular menstruation, unspecified: Secondary | ICD-10-CM | POA: Diagnosis not present

## 2019-12-27 DIAGNOSIS — Z113 Encounter for screening for infections with a predominantly sexual mode of transmission: Secondary | ICD-10-CM

## 2019-12-27 LAB — POCT URINE PREGNANCY: Preg Test, Ur: POSITIVE — AB

## 2019-12-27 NOTE — Progress Notes (Signed)
New Obstetric Patient H&P    Chief Complaint: "Desires prenatal care"   History of Present Illness: Patient is a 44 y.o. G3P1011 Not Hispanic or Latino female, presents with amenorrhea and positive home pregnancy test. Patient's last menstrual period was 10/30/2019 (exact date). and based on her  LMP, her EDD is Estimated Date of Delivery: 08/05/20 and her EGA is [redacted]w[redacted]d. Cycles are 6-7 days, regular, and occur approximately every : 26 days. Her last pap smear was 2 months ago and was no abnormalities.    She had a urine pregnancy test which was positive 3 week(s)  ago. Her last menstrual period was normal and lasted for  6 or 7 day(s). Since her LMP she claims she has experienced breast tenderness, fatigue, mild nausea. She denies vaginal bleeding. Her past medical history is noncontributory. Her prior pregnancies are notable for G1 TAB 2000, G2 FT SVD female 6#14oz  Since her LMP, she admits to the use of tobacco products  no She claims she has gained 8 pounds since the start of her pregnancy.  There are cats in the home in the home  no  She admits close contact with children on a regular basis  yes  She has had chicken pox in the past yes She has had Tuberculosis exposures, symptoms, or previously tested positive for TB   no Current or past history of domestic violence. no  Genetic Screening/Teratology Counseling: (Includes patient, baby's father, or anyone in either family with:)   72. Patient's age >/= 63 at [facility]  yes 2. Thalassemia ([place], [place], [place], or Asian background): MCV<80  no 3. Neural tube defect (meningomyelocele, spina bifida, anencephaly)  no 4. Congenital heart defect  no  5. Down syndrome  no 6. Tay-Sachs (Jewish, [place])  no 7. Canavan's Disease  no 8. Sickle cell disease or trait (African)  no  9. Hemophilia or other blood disorders  no  10. Muscular dystrophy  no  11. Cystic fibrosis  no  12. Huntington's Chorea  no  13. Mental  retardation/autism  no 14. Other inherited genetic or chromosomal disorder  no 15. Maternal metabolic disorder (DM, PKU, etc)  no 16. Patient or FOB with a child with a birth defect not listed above no  16a. Patient or FOB with a birth defect themselves no 17. Recurrent pregnancy loss, or stillbirth  no  18. Any medications since LMP other than prenatal vitamins (include vitamins, supplements, OTC meds, drugs, alcohol)  no 19. Any other genetic/environmental exposure to discuss  no  Infection History:   1. Lives with someone with TB or TB exposed  no  2. Patient or partner has history of genital herpes  Yes takes valtrex episodically 3. Rash or viral illness since LMP  no 4. History of STI (GC, CT, HPV, syphilis, HIV)  no 5. History of recent travel :  no  Other pertinent information:  no     Review of Systems:10 point review of systems negative unless otherwise noted in HPI  Past Medical History:  Patient Active Problem List   Diagnosis Date Noted   Supervision of high risk pregnancy in first trimester 12/27/2019    Clinic [facility] Prenatal Labs  Dating  Blood type:     Genetic Screen 1 Screen:    AFP:     Quad:     NIPS: Antibody:   Anatomic [place]  Rubella:    Varicella: @VZVIGG @  GTT Early: NA  Third trimester:  RPR: Non Reactive (09/02 1636)   Rhogam  HBsAg:     Vaccines TDAP:                       Flu Shot: HIV: Non Reactive (09/02 1636)   Baby Food Plans breast                               GBS:   GC/CT:  Contraception  Pap: 2021 normal  CBB     CS/VBAC NA   Support Person Fiance Burnis Medin of advanced maternal age in first trimester 12/27/2019   Osteoarthritis of knee 02/01/2018   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  Obstetric  History: G3P1011  Family History:  Family History  Problem Relation Age of Onset   Hypertension Father    Diabetes Paternal Grandmother    Breast cancer Neg Hx     Social History:  Social History   Socioeconomic History   Marital status: Single    Spouse name: Not on file   Number of children: Not on file   Years of education: Not on file   Highest education level: Not on file  Occupational History   Not on file  Tobacco Use   Smoking status: Never Smoker   Smokeless tobacco: Never Used  Vaping Use   Vaping Use: Never used  Substance and Sexual Activity   Alcohol use: Yes   Drug use: No   Sexual activity: Yes    Partners: Male    Birth control/protection: None  Other Topics Concern   Not on file  Social History Narrative   Not on file   Social Determinants of Health   Financial Resource Strain:    Difficulty of Paying Living Expenses: Not on file  Food Insecurity:    Worried About Charity fundraiser in the Last Year: Not on file   [employer] of Food in the Last Year: Not on file  Transportation Needs:    Lack of Transportation (Medical): Not on file   Lack of Transportation (Non-Medical): Not on file  Physical Activity:    Days of Exercise per Week: Not on file   Minutes of Exercise per Session: Not on file  Stress:    Feeling of Stress : Not on file  Social Connections:    Frequency of Communication with Friends and Family: Not on file   Frequency of Social Gatherings with Friends and Family: Not on file   Attends Religious Services: Not on file   Active Member of Clubs or Organizations: Not on file   Attends Archivist Meetings: Not on file   Marital Status: Not on file  Intimate Partner Violence:    Fear of Current or Ex-Partner: Not on file   Emotionally Abused: Not on file   Physically Abused: Not on file   Sexually Abused: Not on file    Allergies:  No Known Allergies  Medications: Prior to Admission  medications   Medication Sig Start Date End Date Taking? Authorizing Provider  cholecalciferol (VITAMIN D) 1000 units tablet Take 1,000 Units by mouth daily.   Yes [provider]  ferrous sulfate 325 (65 FE) MG EC tablet Take 325 mg by mouth 3 (three) times daily with meals.   Yes [provider]  valACYclovir (VALTREX) 1000 MG tablet Take 1 tablet (1,000 mg total) by mouth daily. 9/56/38  Yes Copland, Deirdre Evener, PA-C    Physical Exam Vitals: Blood pressure 118/70, height 5\' 4"  (1.626 m), weight 150 lb 3.2 oz (68.1 kg), last menstrual period 10/30/2019.  General: NAD HEENT: normocephalic, anicteric Thyroid: no enlargement, no palpable nodules Pulmonary: No increased work of breathing, CTAB Cardiovascular: RRR, distal pulses 2+ Abdomen: NABS, soft, non-tender, non-distended.  Umbilicus without lesions.  No hepatomegaly, splenomegaly or masses palpable. No evidence of hernia  Genitourinary: deferred for aptima done recently/PAP interval Extremities: no edema, erythema, or tenderness Neurologic: Grossly intact Psychiatric: mood appropriate, affect full  The following were addressed during this visit:  Breastfeeding Education - Early initiation of breastfeeding    Comments: Keeps milk supply adequate, helps contract uterus and slow bleeding, and early milk is the perfect first food and is easy to digest.   - The importance of exclusive breastfeeding    Comments: Provides antibodies, Lower risk of breast and ovarian cancers, and type-2 diabetes,Helps your body recover, Reduced chance of SIDS.   - Risks of giving your baby anything other than breast milk if you are breastfeeding    Comments: Make the baby less content with breastfeeds, may make my baby more susceptible to illness, and may reduce my milk supply.   - The importance of early skin-to-skin contact    Comments: Keeps baby warm and secure, helps keep babys blood sugar up and breathing steady, easier to bond  and breastfeed, and helps calm baby.  - Rooming-in on a 24-hour basis    Comments: Easier to learn baby's feeding cues, easier to bond and get to know each other, and encourages milk production.   - Feeding on demand or baby-led feeding    Comments: Helps prevent breastfeeding complications, helps bring in good milk supply, prevents under or overfeeding, and helps baby feel content and satisfied   - Frequent feeding to help assure optimal milk production    Comments: Making a full supply of milk requires frequent removal of milk from breasts, infant will eat 8-12 times in 24 hours, if separated from infant use breast massage, hand expression and/ or pumping to remove milk from breasts.   - Effective positioning and attachment    Comments: Helps my baby to get enough breast milk, helps to produce an adequate milk supply, and helps prevent nipple pain and damage   - Exclusive breastfeeding for the first 6 months    Comments: Builds a healthy milk supply and keeps it up, protects baby from sickness and disease, and breastmilk has everything your baby needs for the first 6 months.    Assessment: 44 y.o. G3P1011 at [redacted]w[redacted]d presenting to initiate prenatal care  Plan: 1) Avoid alcoholic beverages. 2) Patient encouraged not to smoke.  3) Discontinue the use of all non-medicinal drugs and chemicals.  4) Take prenatal vitamins daily.  5) Nutrition, food safety (fish, cheese advisories, and high nitrite foods) and exercise discussed. 6) Hospital and practice style discussed with cross coverage system.  7) Genetic Screening, such as with 1st Trimester Screening, cell free fetal DNA, AFP testing, and Ultrasound, as well as with amniocentesis and CVS as appropriate, is discussed with patient. At the conclusion of today's visit patient requested cell free genetic testing 8) Patient is asked about travel to  areas at risk for the Zika virus, and counseled to avoid travel and exposure to mosquitoes or  sexual partners who may have themselves been exposed to the virus. Testing is discussed, and will be ordered as appropriate.  9) Urine culture today 10) Return to clinic in 2 weeks for dating scan, rob, NOB panel, Sickle cell screen, MaterniT 21+SCA if 10+ weeks   Rod Can, [facility] 12/27/2019, 11:43 AM

## 2019-12-27 NOTE — Progress Notes (Signed)
Pt here for a New OB visit.

## 2019-12-27 NOTE — Patient Instructions (Signed)
Exercise During Pregnancy Exercise is an important part of being healthy for people of all ages. Exercise improves the function of your heart and lungs and helps you maintain strength, flexibility, and a healthy body weight. Exercise also boosts energy levels and elevates mood. Most women should exercise regularly during pregnancy. In rare cases, women with certain medical conditions or complications may be asked to limit or avoid exercise during pregnancy. How does this affect me? Along with maintaining general strength and flexibility, exercising during pregnancy can help:  Keep strength in muscles that are used during labor and childbirth.  Decrease low back pain.  Reduce symptoms of depression.  Control weight gain during pregnancy.  Reduce the risk of needing insulin if you develop diabetes during pregnancy.  Decrease the risk of cesarean delivery.  Speed up your recovery after giving birth. How does this affect my baby? Exercise can help you have a healthy pregnancy. Exercise does not cause premature birth. It will not cause your baby to weigh less at birth. What exercises can I do? Many exercises are safe for you to do during pregnancy. Do a variety of exercises that safely increase your heart and breathing rates and help you build and maintain muscle strength. Do exercises exactly as told by your health care provider. You may do these exercises:  Walking or hiking.  Swimming.  Water aerobics.  Riding a stationary bike.  Strength training.  Modified yoga or Pilates. Tell your instructor that you are pregnant. Avoid overstretching, and avoid lying on your back for long periods of time.  Running or jogging. Only choose this type of exercise if you: ? Ran or jogged regularly before your pregnancy. ? Can run or jog and still talk in complete sentences. What exercises should I avoid? Depending on your level of fitness and whether you exercised regularly before your  pregnancy, you may be told to limit high-intensity exercise. You can tell that you are exercising at a high intensity if you are breathing much harder and faster and cannot hold a conversation while exercising. You must avoid:  Contact sports.  Activities that put you at risk for falling on or being hit in the belly, such as downhill skiing, water skiing, surfing, rock climbing, cycling, gymnastics, and horseback riding.  Scuba diving.  Skydiving.  Yoga or Pilates in a room that is heated to high temperatures.  Jogging or running, unless you ran or jogged regularly before your pregnancy. While jogging or running, you should always be able to talk in full sentences. Do not run or jog so fast that you are unable to have a conversation.  Do not exercise at more than 6,000 feet above sea level (high elevation) if you are not used to exercising at high elevation. How do I exercise in a safe way?   Avoid overheating. Do not exercise in very high temperatures.  Wear loose-fitting, breathable clothes.  Avoid dehydration. Drink enough water before, during, and after exercise to keep your urine pale yellow.  Avoid overstretching. Because of hormone changes during pregnancy, it is easy to overstretch muscles, tendons, and ligaments during pregnancy.  Start slowly and ask your health care provider to recommend the types of exercise that are safe for you.  Do not exercise to lose weight. Follow these instructions at home:  Exercise on most days or all days of the week. Try to exercise for 30 minutes a day, 5 days a week, unless your health care provider tells you not to.  If  you actively exercised before your pregnancy and you are healthy, your health care provider may tell you to continue to do moderate to high-intensity exercise.  If you are just starting to exercise or did not exercise much before your pregnancy, your health care provider may tell you to do low to moderate-intensity  exercise. Questions to ask your health care provider  Is exercise safe for me?  What are signs that I should stop exercising?  Does my health condition mean that I should not exercise during pregnancy?  When should I avoid exercising during pregnancy? Stop exercising and contact a health care provider if: You have any unusual symptoms, such as:  Mild contractions of the uterus or cramps in the abdomen.  Dizziness that does not go away when you rest. Stop exercising and get help right away if: You have any unusual symptoms, such as:  Sudden, severe pain in your low back or your belly.  Mild contractions of the uterus or cramps in the abdomen that do not improve with rest and drinking fluids.  Chest pain.  Bleeding or fluid leaking from your vagina.  Shortness of breath. These symptoms may represent a serious problem that is an emergency. Do not wait to see if the symptoms will go away. Get medical help right away. Call your local emergency services (911 in the [place] Do not drive yourself to the hospital. Summary  Most women should exercise regularly throughout pregnancy. In rare cases, women with certain medical conditions or complications may be asked to limit or avoid exercise during pregnancy.  Do not exercise to lose weight during pregnancy.  Your health care provider will tell you what level of physical activity is right for you.  Stop exercising and contact a health care provider if you have mild contractions of the uterus or cramps in the abdomen. Get help right away if these contractions or cramps do not improve with rest and drinking fluids.  Stop exercising and get help right away if you have sudden, severe pain in your low back or belly, chest pain, shortness of breath, or bleeding or leaking of fluid from your vagina. This information is not intended to replace advice given to you by your health care provider. Make sure you discuss any questions you have with your  health care provider. Document Revised: 05/24/2018 Document Reviewed: 03/07/2018 Elsevier Patient Education  2020 [place] for Pregnant Women While you are pregnant, your body requires additional nutrition to help support your growing baby. You also have a higher need for some vitamins and minerals, such as folic acid, calcium, iron, and vitamin D. Eating a healthy, well-balanced diet is very important for your health and your baby's health. Your need for extra calories varies for the three 83-month segments of your pregnancy (trimesters). For most women, it is recommended to consume:  150 extra calories a day during the first trimester.  300 extra calories a day during the second trimester.  300 extra calories a day during the third trimester. What are tips for following this plan?   Do not try to lose weight or go on a diet during pregnancy.  Limit your overall intake of foods that have "empty calories." These are foods that have little nutritional value, such as sweets, desserts, candies, and sugar-sweetened beverages.  Eat a variety of foods (especially fruits and vegetables) to get a full range of vitamins and minerals.  Take a prenatal vitamin to help meet your additional vitamin and mineral needs  during pregnancy, specifically for folic acid, iron, calcium, and vitamin D.  Remember to stay active. Ask your health care provider what types of exercise and activities are safe for you.  Practice good food safety and cleanliness. Wash your hands before you eat and after you prepare raw meat. Wash all fruits and vegetables well before peeling or eating. Taking these actions can help to prevent food-borne illnesses that can be very dangerous to your baby, such as listeriosis. Ask your health care provider for more information about listeriosis. What does 150 extra calories look like? Healthy options that provide 150 extra calories each day could be any of the  following:  6-8 oz (170-230 g) of plain low-fat yogurt with  cup of berries.  1 apple with 2 teaspoons (11 g) of peanut butter.  Cut-up vegetables with  cup (60 g) of hummus.  8 oz (230 mL) or 1 cup of low-fat chocolate milk.  1 stick of string cheese with 1 medium orange.  1 peanut butter and jelly sandwich that is made with one slice of whole-wheat bread and 1 tsp (5 g) of peanut butter. For 300 extra calories, you could eat two of those healthy options each day. What is a healthy amount of weight to gain? The right amount of weight gain for you is based on your BMI before you became pregnant. If your BMI:  Was less than 18 (underweight), you should gain 28-40 lb (13-18 kg).  Was 18-24.9 (normal), you should gain 25-35 lb (11-16 kg).  Was 25-29.9 (overweight), you should gain 15-25 lb (7-11 kg).  Was 30 or greater (obese), you should gain 11-20 lb (5-9 kg). What if I am having twins or multiples? Generally, if you are carrying twins or multiples:  You may need to eat 300-600 extra calories a day.  The recommended range for total weight gain is 25-54 lb (11-25 kg), depending on your BMI before pregnancy.  Talk with your health care provider to find out about nutritional needs, weight gain, and exercise that is right for you. What foods can I eat?  Fruits All fruits. Eat a variety of colors and types of fruit. Remember to wash your fruits well before peeling or eating. Vegetables All vegetables. Eat a variety of colors and types of vegetables. Remember to wash your vegetables well before peeling or eating. Grains All grains. Choose whole grains, such as whole-wheat bread, oatmeal, or brown rice. Meats and other protein foods Lean meats, including chicken, [place], fish, and lean cuts of beef, veal, or pork. If you eat fish or seafood, choose options that are higher in omega-3 fatty acids and lower in mercury, such as salmon, herring, mussels, trout, sardines, pollock,  shrimp, crab, and lobster. Tofu. Tempeh. Beans. Eggs. Peanut butter and other nut butters. Make sure that all meats, poultry, and eggs are cooked to food-safe temperatures or "well-done." Two or more servings of fish are recommended each week in order to get the most benefits from omega-3 fatty acids that are found in seafood. Choose fish that are lower in mercury. You can find more information online:  [employer] Dairy Pasteurized milk and milk alternatives (such as almond milk). Pasteurized yogurt and pasteurized cheese. Cottage cheese. Sour cream. Beverages Water. Juices that contain 100% fruit juice or vegetable juice. Caffeine-free teas and decaffeinated coffee. Drinks that contain caffeine are okay to drink, but it is better to avoid caffeine. Keep your total caffeine intake to less than 200 mg each day (which is 12 oz  or 355 mL of coffee, tea, or soda) or the limit as told by your health care provider. Fats and oils Fats and oils are okay to include in moderation. Sweets and desserts Sweets and desserts are okay to include in moderation. Seasoning and other foods All pasteurized condiments. The items listed above may not be a complete list of foods and beverages you can eat. Contact a dietitian for more information. What foods are not recommended? Fruits Unpasteurized fruit juices. Vegetables Raw (unpasteurized) vegetable juices. Meats and other protein foods Lunch meats, bologna, hot dogs, or other deli meats. (If you must eat those meats, reheat them until they are steaming hot.) Refrigerated pat, meat spreads from a meat counter, smoked seafood that is found in the refrigerated section of a store. Raw or undercooked meats, poultry, and eggs. Raw fish, such as sushi or sashimi. Fish that have high mercury content, such as tilefish, shark, swordfish, and king mackerel. To learn more about mercury in fish, talk with your health care provider or look for online resources, such  as:  [place] Dairy Raw (unpasteurized) milk and any foods that have raw milk in them. Soft cheeses, such as feta, queso blanco, queso fresco, Brie, Camembert cheeses, blue-veined cheeses, and Panela cheese (unless it is made with pasteurized milk, which must be stated on the label). Beverages Alcohol. Sugar-sweetened beverages, such as sodas, teas, or energy drinks. Seasoning and other foods Homemade fermented foods and drinks, such as pickles, sauerkraut, or kombucha drinks. (Store-bought pasteurized versions of these are okay.) Salads that are made in a store or deli, such as ham salad, chicken salad, egg salad, tuna salad, and seafood salad. The items listed above may not be a complete list of foods and beverages you should avoid. Contact a dietitian for more information. Where to find more information To calculate the number of calories you need based on your height, weight, and activity level, you can use an online calculator such as:  MobileTransition.ch To calculate how much weight you should gain during pregnancy, you can use an online pregnancy weight gain calculator such as:  StreamingFood.com.cy Summary  While you are pregnant, your body requires additional nutrition to help support your growing baby.  Eat a variety of foods, especially fruits and vegetables to get a full range of vitamins and minerals.  Practice good food safety and cleanliness. Wash your hands before you eat and after you prepare raw meat. Wash all fruits and vegetables well before peeling or eating. Taking these actions can help to prevent food-borne illnesses, such as listeriosis, that can be very dangerous to your baby.  Do not eat raw meat or fish. Do not eat fish that have high mercury content, such as tilefish, shark, swordfish, and king mackerel. Do not eat unpasteurized (raw) dairy.  Take a prenatal vitamin to help meet your additional vitamin and  mineral needs during pregnancy, specifically for folic acid, iron, calcium, and vitamin D. This information is not intended to replace advice given to you by your health care provider. Make sure you discuss any questions you have with your health care provider. Document Revised: 06/21/2018 Document Reviewed: 10/28/2016 Elsevier Patient Education  2020 [employer]. Prenatal Care Prenatal care is health care during pregnancy. It helps you and your unborn baby (fetus) stay as healthy as possible. Prenatal care may be provided by a midwife, a family practice health care provider, or a childbirth and pregnancy specialist (obstetrician). How does this affect me? During pregnancy, you will be closely monitored  for any new conditions that might develop. To lower your risk of pregnancy complications, you and your health care provider will talk about any underlying conditions you have. How does this affect my baby? Early and consistent prenatal care increases the chance that your baby will be healthy during pregnancy. Prenatal care lowers the risk that your baby will be:  Born early (prematurely).  Smaller than expected at birth (small for gestational age). What can I expect at the first prenatal care visit? Your first prenatal care visit will likely be the longest. You should schedule your first prenatal care visit as soon as you know that you are pregnant. Your first visit is a good time to talk about any questions or concerns you have about pregnancy. At your visit, you and your health care provider will talk about:  Your medical history, including: ? Any past pregnancies. ? Your family's medical history. ? The baby's father's medical history. ? Any long-term (chronic) health conditions you have and how you manage them. ? Any surgeries or procedures you have had. ? Any current over-the-counter or prescription medicines, herbs, or supplements you are taking.  Other factors that could pose a risk  to your baby, including:  Your home setting and your stress levels, including: ? Exposure to abuse or violence. ? Household financial strain. ? Mental health conditions you have.  Your daily health habits, including diet and exercise. Your health care provider will also:  Measure your weight, height, and blood pressure.  Do a physical exam, including a pelvic and breast exam.  Perform blood tests and urine tests to check for: ? Urinary tract infection. ? Sexually transmitted infections (STIs). ? Low iron levels in your blood (anemia). ? Blood type and certain proteins on red blood cells (Rh antibodies). ? Infections and immunity to viruses, such as hepatitis B and rubella. ? HIV (human immunodeficiency virus).  Do an ultrasound to confirm your baby's growth and development and to help predict your estimated due date (EDD). This ultrasound is done with a probe that is inserted into the vagina (transvaginal ultrasound).  Discuss your options for genetic screening.  Give you information about how to keep yourself and your baby healthy, including: ? Nutrition and taking vitamins. ? Physical activity. ? How to manage pregnancy symptoms such as nausea and vomiting (morning sickness). ? Infections and substances that may be harmful to your baby and how to avoid them. ? Food safety. ? Dental care. ? Working. ? Travel. ? Warning signs to watch for and when to call your health care provider. How often will I have prenatal care visits? After your first prenatal care visit, you will have regular visits throughout your pregnancy. The visit schedule is often as follows:  Up to week 28 of pregnancy: once every 4 weeks.  28-36 weeks: once every 2 weeks.  After 36 weeks: every week until delivery. Some women may have visits more or less often depending on any underlying health conditions and the health of the baby. Keep all follow-up and prenatal care visits as told by your health care  provider. This is important. What happens during routine prenatal care visits? Your health care provider will:  Measure your weight and blood pressure.  Check for fetal heart sounds.  Measure the height of your uterus in your abdomen (fundal height). This may be measured starting around week 20 of pregnancy.  Check the position of your baby inside your uterus.  Ask questions about your diet, sleeping patterns, and  whether you can feel the baby move.  Review warning signs to watch for and signs of labor.  Ask about any pregnancy symptoms you are having and how you are dealing with them. Symptoms may include: ? Headaches. ? Nausea and vomiting. ? Vaginal discharge. ? Swelling. ? Fatigue. ? Constipation. ? Any discomfort, including back or pelvic pain. Make a list of questions to ask your health care provider at your routine visits. What tests might I have during prenatal care visits? You may have blood, urine, and imaging tests throughout your pregnancy, such as:  Urine tests to check for glucose, protein, or signs of infection.  Glucose tests to check for a form of diabetes that can develop during pregnancy (gestational diabetes mellitus). This is usually done around week 24 of pregnancy.  An ultrasound to check your baby's growth and development and to check for birth defects. This is usually done around week 20 of pregnancy.  A test to check for group B strep (GBS) infection. This is usually done around week 36 of pregnancy.  Genetic testing. This may include blood or imaging tests, such as an ultrasound. Some genetic tests are done during the first trimester and some are done during the second trimester. What else can I expect during prenatal care visits? Your health care provider may recommend getting certain vaccines during pregnancy. These may include:  A yearly flu shot (annual influenza vaccine). This is especially important if you will be pregnant during flu  season.  Tdap (tetanus, diphtheria, pertussis) vaccine. Getting this vaccine during pregnancy can protect your baby from whooping cough (pertussis) after birth. This vaccine may be recommended between weeks 27 and 36 of pregnancy. Later in your pregnancy, your health care provider may give you information about:  Childbirth and breastfeeding classes.  Choosing a health care provider for your baby.  Umbilical cord banking.  Breastfeeding.  Birth control after your baby is born.  The hospital labor and delivery unit and how to tour it.  Registering at the hospital before you go into labor. Where to find more information  Office on Women's Health: LegalWarrants.gl  American Pregnancy Association: americanpregnancy.org  March of Dimes: marchofdimes.org Summary  Prenatal care helps you and your baby stay as healthy as possible during pregnancy.  Your first prenatal care visit will most likely be the longest.  You will have visits and tests throughout your pregnancy to monitor your health and your baby's health.  Bring a list of questions to your visits to ask your health care provider.  Make sure to keep all follow-up and prenatal care visits with your health care provider. This information is not intended to replace advice given to you by your health care provider. Make sure you discuss any questions you have with your health care provider. Document Revised: 05/23/2018 Document Reviewed: 01/30/2017 Elsevier Patient Education  [place].

## 2019-12-29 LAB — URINE CULTURE

## 2020-01-08 ENCOUNTER — Other Ambulatory Visit: Payer: Self-pay

## 2020-01-08 ENCOUNTER — Encounter: Payer: Self-pay | Admitting: Advanced Practice Midwife

## 2020-01-08 ENCOUNTER — Ambulatory Visit (INDEPENDENT_AMBULATORY_CARE_PROVIDER_SITE_OTHER): Payer: Managed Care, Other (non HMO)

## 2020-01-08 ENCOUNTER — Ambulatory Visit (INDEPENDENT_AMBULATORY_CARE_PROVIDER_SITE_OTHER): Payer: Managed Care, Other (non HMO) | Admitting: Advanced Practice Midwife

## 2020-01-08 VITALS — BP 120/72 | Wt 152.0 lb

## 2020-01-08 DIAGNOSIS — O09521 Supervision of elderly multigravida, first trimester: Secondary | ICD-10-CM

## 2020-01-08 DIAGNOSIS — Z3A10 10 weeks gestation of pregnancy: Secondary | ICD-10-CM | POA: Diagnosis not present

## 2020-01-08 DIAGNOSIS — O0991 Supervision of high risk pregnancy, unspecified, first trimester: Secondary | ICD-10-CM | POA: Diagnosis not present

## 2020-01-08 DIAGNOSIS — O285 Abnormal chromosomal and genetic finding on antenatal screening of mother: Secondary | ICD-10-CM

## 2020-01-08 DIAGNOSIS — Z130 Encounter for screening for diseases of the blood and blood-forming organs and certain disorders involving the immune mechanism: Secondary | ICD-10-CM

## 2020-01-08 DIAGNOSIS — Z113 Encounter for screening for infections with a predominantly sexual mode of transmission: Secondary | ICD-10-CM

## 2020-01-08 LAB — POCT URINALYSIS DIPSTICK OB
Glucose, UA: NEGATIVE
POC,PROTEIN,UA: NEGATIVE

## 2020-01-08 NOTE — Progress Notes (Signed)
ROB/Dating Scan- started spotting yesterday pm and still is today, wearing liner, no abnormal pain

## 2020-01-08 NOTE — Progress Notes (Addendum)
Routine Prenatal Care Visit  Subjective  Cheryl Macias is a 44 y.o. G3P1011 at [redacted]w[redacted]d being seen today for ongoing prenatal care.  She is currently monitored for the following issues for this high-risk pregnancy and has Cold sore; Osteoarthritis of knee; Supervision of high risk pregnancy in first trimester; and Multigravida of advanced maternal age in first trimester on their problem list.  ----------------------------------------------------------------------------------- Patient reports red spotting that started yesterday. Today it is decreasing and pink in color. She denies cramping.    . Vag. Bleeding: None.   . Leaking Fluid denies.  ----------------------------------------------------------------------------------- The following portions of the patient's history were reviewed and updated as appropriate: allergies, current medications, past family history, past medical history, past social history, past surgical history and problem list. Problem list updated.  Objective  Blood pressure 120/72, weight 152 lb (68.9 kg), last menstrual period 10/30/2019. Pregravid weight 142 lb (64.4 kg) Total Weight Gain 10 lb (4.536 kg) Urinalysis: Urine Protein Negative  Urine Glucose Negative  Fetal Status: Fetal Heart Rate (bpm): 165          Dating scan: 10 weeks 0 days = LMP, no adjustment of EDD 6 uterine fibroids seen: largest is pedunculated right side 7.8 cm x 5 cm x 4.9 cm The other 5 are smaller- approximately 2 cm x 2cm x 2cm.  They are intramural and subserosal  General:  Alert, oriented and cooperative. Patient is in no acute distress.  Skin: Skin is warm and dry. No rash noted.   Cardiovascular: Normal heart rate noted  Respiratory: Normal respiratory effort, no problems with respiration noted  Abdomen: Soft, gravid, appropriate for gestational age.       Pelvic:  Cervical exam deferred        Extremities: Normal range of motion.     Mental Status: Normal mood and affect. Normal  behavior. Normal judgment and thought content.   Assessment   44 y.o. G3P1011 at [redacted]w[redacted]d by  08/05/2020, by Last Menstrual Period presenting for routine prenatal visit  Plan   pregnancy 3 Problems (from 12/27/19 to present)    Problem Noted Resolved   Supervision of high risk pregnancy in first trimester 12/27/2019 by Cheryl Macias, CNM No   Overview Signed 12/27/2019 11:38 AM by Cheryl Macias, [place] Prenatal Labs  Dating EDD by LMP = 10w u/s Blood type:     Genetic Screen 1 Screen:    AFP:     Quad:     NIPS: Antibody:   Anatomic [place]  Rubella:    Varicella: @VZVIGG @  GTT Early: NA                Third trimester:  RPR: Non Reactive (09/02 1636)   Rhogam  HBsAg:     Vaccines TDAP:                       Flu Shot: HIV: Non Reactive (09/02 1636)   Baby Food Plans breast                               GBS:   GC/CT:  Contraception  Pap: 2021 normal  CBB     CS/VBAC NA   Support Person Fiance Paul           NOB panel, sickle cell screen and MaterniT 21 + SCA labs today   Preterm labor symptoms and general obstetric precautions including but not  limited to vaginal bleeding, contractions, leaking of fluid and fetal movement were reviewed in detail with the patient. Please refer to After Visit Summary for other counseling recommendations.   Return in about 4 weeks (around 02/05/2020) for rob.  Cheryl Macias, CNM 01/08/2020 3:40 PM

## 2020-01-09 LAB — RPR+RH+ABO+RUB AB+AB SCR+CB...
Antibody Screen: NEGATIVE
HIV Screen 4th Generation wRfx: NONREACTIVE
Hematocrit: 31.4 % — ABNORMAL LOW (ref 34.0–46.6)
Hemoglobin: 9.8 g/dL — ABNORMAL LOW (ref 11.1–15.9)
Hepatitis B Surface Ag: NEGATIVE
MCH: 23.4 pg — ABNORMAL LOW (ref 26.6–33.0)
MCHC: 31.2 g/dL — ABNORMAL LOW (ref 31.5–35.7)
MCV: 75 fL — ABNORMAL LOW (ref 79–97)
Platelets: 307 10*3/uL (ref 150–450)
RBC: 4.18 x10E6/uL (ref 3.77–5.28)
RDW: 18.3 % — ABNORMAL HIGH (ref 11.7–15.4)
RPR Ser Ql: NONREACTIVE
Rh Factor: POSITIVE
Rubella Antibodies, IGG: 1.96 index (ref >0.99)
Varicella zoster IgG: 905 index (ref >165)
WBC: 6.9 10*3/uL (ref 3.4–10.8)

## 2020-01-13 LAB — HGB FRACTIONATION CASCADE
Hgb A2: 2.4 % (ref 1.8–3.2)
Hgb A: 97.6 % (ref 96.4–98.8)
Hgb F: 0 % (ref 0.0–2.0)
Hgb S: 0 %

## 2020-01-16 LAB — MATERNIT21 PLUS CORE+SCA
Fetal Fraction: 4
Monosomy X (Turner Syndrome): NOT DETECTED
Result (T21): POSITIVE — AB
Trisomy 13 (Patau syndrome): NEGATIVE
Trisomy 21 (Down syndrome): NEGATIVE
XXX (Triple X Syndrome): NOT DETECTED
XXY (Klinefelter Syndrome): NOT DETECTED
XYY (Jacobs Syndrome): NOT DETECTED

## 2020-01-17 NOTE — Addendum Note (Signed)
Addended by: Rod Can on: 01/17/2020 04:58 PM   Modules accepted: Orders

## 2020-01-17 NOTE — Progress Notes (Signed)
Referral for MFM request sent for genetic counseling due to + Trisomy 18 screening.

## 2020-01-20 ENCOUNTER — Encounter: Payer: Managed Care, Other (non HMO) | Admitting: Obstetrics

## 2020-01-23 ENCOUNTER — Telehealth: Payer: Self-pay | Admitting: Obstetrics and Gynecology

## 2020-01-23 ENCOUNTER — Other Ambulatory Visit: Payer: Self-pay | Admitting: Advanced Practice Midwife

## 2020-01-23 DIAGNOSIS — O09521 Supervision of elderly multigravida, first trimester: Secondary | ICD-10-CM

## 2020-01-23 DIAGNOSIS — O289 Unspecified abnormal findings on antenatal screening of mother: Secondary | ICD-10-CM

## 2020-01-23 NOTE — Telephone Encounter (Signed)
I called [name] to offer genetic counseling and u/s today at [facility] at [facility] after getting a message from [facility] about adding her on.  The patient is AMA and has an abnormal MaterniT21 result showing an increased risk for Trisomy 73.  We spoke briefly about the results and testing options.  The patient declined to come today, as her partner cannot come with her.  She prefers to keep the time for Monday, with genetic counseling at 1:00 and ultrasound at 2:00. We will see her at that time.  We can be reached at [phone] if needed.  Wilburt Finlay, MS, CGC

## 2020-01-23 NOTE — Telephone Encounter (Signed)
M[facility] Care can see this patient today. They have left her a voice mail. I have sent her a my chart message to notify and advised her to contact them for scheduling.

## 2020-01-23 NOTE — Telephone Encounter (Signed)
Can you show jane this, this afternoon

## 2020-01-27 ENCOUNTER — Other Ambulatory Visit: Payer: Self-pay

## 2020-01-27 ENCOUNTER — Ambulatory Visit (HOSPITAL_BASED_OUTPATIENT_CLINIC_OR_DEPARTMENT_OTHER): Payer: Managed Care, Other (non HMO)

## 2020-01-27 ENCOUNTER — Ambulatory Visit: Payer: Managed Care, Other (non HMO) | Attending: Obstetrics and Gynecology

## 2020-01-27 ENCOUNTER — Ambulatory Visit: Payer: Managed Care, Other (non HMO)

## 2020-01-27 ENCOUNTER — Other Ambulatory Visit: Payer: Self-pay | Admitting: Advanced Practice Midwife

## 2020-01-27 DIAGNOSIS — O358XX0 Maternal care for other (suspected) fetal abnormality and damage, not applicable or unspecified: Secondary | ICD-10-CM

## 2020-01-27 DIAGNOSIS — O289 Unspecified abnormal findings on antenatal screening of mother: Secondary | ICD-10-CM | POA: Diagnosis present

## 2020-01-27 DIAGNOSIS — O09521 Supervision of elderly multigravida, first trimester: Secondary | ICD-10-CM

## 2020-01-27 DIAGNOSIS — D649 Anemia, unspecified: Secondary | ICD-10-CM | POA: Diagnosis not present

## 2020-01-27 DIAGNOSIS — O99011 Anemia complicating pregnancy, first trimester: Secondary | ICD-10-CM | POA: Diagnosis not present

## 2020-01-27 DIAGNOSIS — Z3A12 12 weeks gestation of pregnancy: Secondary | ICD-10-CM | POA: Diagnosis not present

## 2020-01-27 DIAGNOSIS — O285 Abnormal chromosomal and genetic finding on antenatal screening of mother: Secondary | ICD-10-CM

## 2020-01-27 DIAGNOSIS — O0991 Supervision of high risk pregnancy, unspecified, first trimester: Secondary | ICD-10-CM

## 2020-01-27 DIAGNOSIS — O281 Abnormal biochemical finding on antenatal screening of mother: Secondary | ICD-10-CM

## 2020-01-27 DIAGNOSIS — Z3A00 Weeks of gestation of pregnancy not specified: Secondary | ICD-10-CM | POA: Diagnosis not present

## 2020-01-27 DIAGNOSIS — D259 Leiomyoma of uterus, unspecified: Secondary | ICD-10-CM | POA: Diagnosis not present

## 2020-01-27 DIAGNOSIS — O3411 Maternal care for benign tumor of corpus uteri, first trimester: Secondary | ICD-10-CM | POA: Insufficient documentation

## 2020-01-27 NOTE — Progress Notes (Signed)
Cheryl Macias was referred for genetic counseling to review lab results and testing options, as her noninvasive prenatal screening (NIPS) revealed an increased risk of trisomy 2 in the current pregnancy.  We first reviewed chromosomes and conditions related to changes in the number or structure of chromosomes.  For typical development, individuals have two copies of all their chromosomes, with one inherited from each parent.  There are 23 pairs of chromosomes including the sex chromosomes (XX for a female XY for a female). An extra or missing chromosome or portion of a chromosome results in physical and or developmental differences. Trisomy 18 results from having three copies of chromosome 18. This most often occurs due to a random error in chromosomal division during the formation of reproductive cells in a process called nondisjunction. Rarely, trisomy 18 may result from a translocation involving chromosome 58 and another chromosome. This condition is associated with life-threatening medical complications and severe intellectual disability. Fetuses affected with trisomy 18 may present with central nervous system abnormalities, cardiac defects, gastrointestinal anomalies, growth restriction, and abnormalities of the feet and hands including club feet, clenched hands, and overlapping fingers. Due to the severity of the condition, approximately 60-80% of pregnancies affected with trisomy 18 result in miscarriage, fetal demise, or stillbirth. Of liveborn babies, approximately 10% survive beyond the first year of life.    We reviewed that NIPS analyzes cell free DNA originating from the placenta that is found in the maternal blood circulation during pregnancy. This test can provide information regarding the presence or absence of extra fetal DNA for chromosomes 13, 18 and 21. The reported detection rate is greater than 99% for trisomy 21, greater than 99% for trisomy 18, and greater than 91% for trisomy 13. However,  it cannot be considered diagnostic for chromosome conditions. Positive predictive value (PPV) is the probability that a pregnancy with a positive test result is truly affected. This number is determined by the accuracy of the test based upon the a priori chance for the condition in the pregnancy.  Because Cheryl Macias is 44 years old, the prior risk for any chromosome condition in this pregnancy is increased. The chance for any chromosome condition in the first trimester when the mother is 38 years old is estimated to be 1 in 72.  The chance for Down syndrome is 1 in 64 and the chance for trisomy 18 in the first trimester is estimated to be 1 in 50-100.  The PPV reported by the NIPS laboratory for trisomy 18 in the current pregnancy was estimated to be 78.2%. The PPV calculator offered by the [employer] estimated PPV for trisomy 18 in the current pregnancy to be 79%. It is important to remember that this testing is a screening test and cannot diagnose or rule out any chromosome condition.    Cheryl Macias was counseled that there are several possible explanations for her high risk NIPS result. First, the fetus could truly be affected by trisomy 20. Second, the fetus could be mosaic for trisomy 60. Mosaicism occurs when an individual has two or more genetically different sets of cells in their body. Individuals who are mosaic for trisomy 18 have some cells in the body with trisomy 26 and may have other cells that are chromosomally normal. We discussed that it would not be possible to tell which features an individual with mosaic trisomy 18 may have, as it is impossible to assess which specific cells and tissues in the body  have trisomy 18. Another possibility is that the placenta could have trisomy 18 while the fetus could be unaffected. This is a phenomenon known as confined placental mosaicism (CPM). Lastly, this could be a false positive result  that resulted due to normal variation.   Cheryl Macias had a limited ultrasound at the time of this visit, at 12 weeks, 5 days.  No anomalies were noted at this time, though it is still early to visualize the anatomy. We discussed that this ultrasound will be limited due to early gestational age, and fetuses affected by trisomy 18 may not show any signs on an early ultrasound. Cheryl Macias will also be returning to [facility] for her anatomy ultrasound in the second trimester. She was counseled that approximately 90% of fetuses with trisomy 54 will have one or more finding on anatomy ultrasound, including congenital heart defects, congenital diaphragmatic hernia, omphalocele, microcephaly, renal anomalies, open neural tube defects, limb abnormalities such as polydactyly, clenched hands, and rocker bottom feet, and fetal growth restriction.    Cheryl Macias was also counseled regarding the option of diagnostic testing via chorionic villus sampling (CVS) or amniocentesis. We discussed the technical aspects of each procedure and quoted up to a 1 in 500 (0.2%) risk for spontaneous pregnancy loss or other adverse pregnancy outcomes as a result of either procedure. Cultured cells from either a placental or amniotic fluid sample allow for the visualization of a fetal karyotype, which can detect >99% of chromosomal conditions including trisomy 18. Chromosomal microarray can also be performed to identify smaller deletions or duplications of fetal chromosomal material. Cheryl Macias was informed that diagnostic testing would be the most definitive test available to determine if the fetus has trisomy 18 prenatally. She was also made aware that she can continue with standard ultrasounds to monitor for fetal anomalies and that she has the option to wait to pursue genetic testing postnatally if desired.   After careful consideration, Cheryl Macias declined additional testing at this time.  She was given my card  to contact me if she desires CVS, which can be scheduled next week in [place] if needed.  Otherwise, she was scheduled to return to our clinic in 5 weeks for a more detailed ultrasound with the option of amniocentesis if desired.   Lastly, we discussed that different families may make different choices about their pregnancy if a chromosome condition is identified. Cheryl Macias and her partner are aware that she has the option of termination of pregnancy until [redacted] weeks gestation in the state of [place] and indicated that she may consider this option depending upon the results.  However, she was also made aware that if she were to continue the pregnancy, we will plan for multiple ultrasounds and evalautions to manage her care throughout the pregnancy.    Finally, we obtained a detailed pregnancy history and family history. She reported that this is her third pregnancy, the first with her partner, Cheryl Macias. She has a healthy 109 year old son and had one elected termination for personal reasons.  Cheryl Macias has four healthy daughters, ages 48, 37, 13, and 23 years.  The family history was unremarkable for birth defects, developmental delays, recurrent pregnancy loss and known genetic conditions.  She reported no complications in this pregnancy other than spotting, and no exposure to medications, alcohol, tobacco or recreational drugs. Of note, she has a very low MCV (75) and abnormal CBC.  Her hemoglobinopathy evaluation is normal (AA), with normal A2. If she is not found  to be iron deficient, then consideration of additional testing for thalassemia trait could be considered.  Thank you for the opportunity to be involved with this family. We may be reached at [phone].   Wilburt Finlay, MS, CGC

## 2020-02-06 ENCOUNTER — Ambulatory Visit (INDEPENDENT_AMBULATORY_CARE_PROVIDER_SITE_OTHER): Payer: Managed Care, Other (non HMO) | Admitting: Advanced Practice Midwife

## 2020-02-06 ENCOUNTER — Encounter: Payer: Self-pay | Admitting: Advanced Practice Midwife

## 2020-02-06 ENCOUNTER — Other Ambulatory Visit: Payer: Self-pay

## 2020-02-06 VITALS — BP 122/74 | Wt 155.0 lb

## 2020-02-06 DIAGNOSIS — O285 Abnormal chromosomal and genetic finding on antenatal screening of mother: Secondary | ICD-10-CM | POA: Insufficient documentation

## 2020-02-06 DIAGNOSIS — O0992 Supervision of high risk pregnancy, unspecified, second trimester: Secondary | ICD-10-CM

## 2020-02-06 DIAGNOSIS — O09522 Supervision of elderly multigravida, second trimester: Secondary | ICD-10-CM

## 2020-02-06 DIAGNOSIS — Z3A14 14 weeks gestation of pregnancy: Secondary | ICD-10-CM

## 2020-02-06 NOTE — Progress Notes (Signed)
Routine Prenatal Care Visit  Subjective  Cheryl Macias is a 44 y.o. G3P1011 at [redacted]w[redacted]d being seen today for ongoing prenatal care.  She is currently monitored for the following issues for this high-risk pregnancy and has Cold sore; Osteoarthritis of knee; Supervision of high risk pregnancy in first trimester; Multigravida of advanced maternal age in first trimester; and Abnormal genetic test during pregnancy on their problem list.  ----------------------------------------------------------------------------------- Patient reports no complaints. She has additional questions regarding causes of trisomies and also paternity testing. She will have anatomy scan at MFM which is already scheduled. The option of amniocentesis is being considered.   . Vag. Bleeding: None.   . Leaking Fluid denies.  ----------------------------------------------------------------------------------- The following portions of the patient's history were reviewed and updated as appropriate: allergies, current medications, past family history, past medical history, past social history, past surgical history and problem list. Problem list updated.  Objective  Blood pressure 122/74, weight 155 lb (70.3 kg), last menstrual period 10/30/2019. Pregravid weight 142 lb (64.4 kg) Total Weight Gain 13 lb (5.897 kg) Urinalysis: Urine Protein    Urine Glucose    Fetal Status: Fetal Heart Rate (bpm): 152         General:  Alert, oriented and cooperative. Patient is in no acute distress.  Skin: Skin is warm and dry. No rash noted.   Cardiovascular: Normal heart rate noted  Respiratory: Normal respiratory effort, no problems with respiration noted  Abdomen: Soft, gravid, appropriate for gestational age. Pain/Pressure: Absent     Pelvic:  Cervical exam deferred        Extremities: Normal range of motion.  Edema: None  Mental Status: Normal mood and affect. Normal behavior. Normal judgment and thought content.   Assessment   44  y.o. G3P1011 at [redacted]w[redacted]d by  08/05/2020, by Last Menstrual Period presenting for routine prenatal visit  Plan   pregnancy 3 Problems (from 12/27/19 to present)    Problem Noted Resolved   Abnormal genetic test during pregnancy 02/06/2020 by Rod Can, CNM No   Supervision of high risk pregnancy in first trimester 12/27/2019 by Rod Can, CNM No   Overview Addendum 01/08/2020  3:49 PM by Rod Can, [place] Prenatal Labs  Dating EDD by LMP = 10w u/s Blood type:     Genetic Screen 1 Screen:    AFP:     Quad:     NIPS: Antibody:   Anatomic [place]  Rubella:    Varicella: @VZVIGG @  GTT Early: NA                Third trimester:  RPR: Non Reactive (09/02 1636)   Rhogam  HBsAg:     Vaccines TDAP:                       Flu Shot: HIV: Non Reactive (09/02 1636)   Baby Food Plans breast                               GBS:   GC/CT:  Contraception  Pap: 2021 normal  CBB     CS/VBAC NA   Support Person Lyman Speller          Previous Version       Preterm labor symptoms and general obstetric precautions including but not limited to vaginal bleeding, contractions, leaking of fluid and fetal movement were reviewed in detail with the patient. Please refer  to After Visit Summary for other counseling recommendations.   Return in about 4 weeks (around 03/05/2020) for rob.  Rod Can, CNM 02/06/2020 8:38 AM

## 2020-02-06 NOTE — Patient Instructions (Signed)

## 2020-02-06 NOTE — Progress Notes (Signed)
No vb. No lof.  

## 2020-02-15 DIAGNOSIS — A549 Gonococcal infection, unspecified: Secondary | ICD-10-CM

## 2020-02-15 HISTORY — DX: Gonococcal infection, unspecified: A54.9

## 2020-02-27 ENCOUNTER — Other Ambulatory Visit: Payer: Self-pay | Admitting: Advanced Practice Midwife

## 2020-02-27 DIAGNOSIS — O09522 Supervision of elderly multigravida, second trimester: Secondary | ICD-10-CM

## 2020-02-27 DIAGNOSIS — O289 Unspecified abnormal findings on antenatal screening of mother: Secondary | ICD-10-CM

## 2020-02-27 DIAGNOSIS — O99012 Anemia complicating pregnancy, second trimester: Secondary | ICD-10-CM

## 2020-03-03 ENCOUNTER — Ambulatory Visit: Payer: Managed Care, Other (non HMO) | Attending: Maternal & Fetal Medicine

## 2020-03-03 ENCOUNTER — Other Ambulatory Visit: Payer: Self-pay

## 2020-03-03 ENCOUNTER — Ambulatory Visit (HOSPITAL_BASED_OUTPATIENT_CLINIC_OR_DEPARTMENT_OTHER): Payer: Managed Care, Other (non HMO) | Admitting: Maternal & Fetal Medicine

## 2020-03-03 DIAGNOSIS — O350XX0 Maternal care for (suspected) central nervous system malformation in fetus, not applicable or unspecified: Secondary | ICD-10-CM | POA: Diagnosis not present

## 2020-03-03 DIAGNOSIS — Z3A17 17 weeks gestation of pregnancy: Secondary | ICD-10-CM

## 2020-03-03 DIAGNOSIS — O285 Abnormal chromosomal and genetic finding on antenatal screening of mother: Secondary | ICD-10-CM

## 2020-03-03 DIAGNOSIS — O358XX0 Maternal care for other (suspected) fetal abnormality and damage, not applicable or unspecified: Secondary | ICD-10-CM | POA: Diagnosis not present

## 2020-03-03 DIAGNOSIS — O09521 Supervision of elderly multigravida, first trimester: Secondary | ICD-10-CM

## 2020-03-03 DIAGNOSIS — O365920 Maternal care for other known or suspected poor fetal growth, second trimester, not applicable or unspecified: Secondary | ICD-10-CM

## 2020-03-03 DIAGNOSIS — O43122 Velamentous insertion of umbilical cord, second trimester: Secondary | ICD-10-CM | POA: Diagnosis not present

## 2020-03-03 DIAGNOSIS — O43892 Other placental disorders, second trimester: Secondary | ICD-10-CM

## 2020-03-03 DIAGNOSIS — O351XX0 Maternal care for (suspected) chromosomal abnormality in fetus, not applicable or unspecified: Secondary | ICD-10-CM | POA: Diagnosis not present

## 2020-03-03 DIAGNOSIS — O3512X0 Maternal care for (suspected) chromosomal abnormality in fetus, trisomy 18, not applicable or unspecified: Secondary | ICD-10-CM | POA: Insufficient documentation

## 2020-03-03 DIAGNOSIS — O99012 Anemia complicating pregnancy, second trimester: Secondary | ICD-10-CM

## 2020-03-03 DIAGNOSIS — O09512 Supervision of elderly primigravida, second trimester: Secondary | ICD-10-CM | POA: Insufficient documentation

## 2020-03-03 DIAGNOSIS — O09522 Supervision of elderly multigravida, second trimester: Secondary | ICD-10-CM

## 2020-03-03 DIAGNOSIS — O0991 Supervision of high risk pregnancy, unspecified, first trimester: Secondary | ICD-10-CM

## 2020-03-03 DIAGNOSIS — O289 Unspecified abnormal findings on antenatal screening of mother: Secondary | ICD-10-CM

## 2020-03-03 DIAGNOSIS — O321XX0 Maternal care for breech presentation, not applicable or unspecified: Secondary | ICD-10-CM

## 2020-03-03 NOTE — Progress Notes (Signed)
MFM Consultation  Reason for request: Trisomy 71 Date of Service: 03/03/20 Requesting provider: Rod Can, CNM   [name] is a G3P1 at 68 w 6 d who is here for follow up anatomy given a known NIPS result fo T18.  She had genetic counseling and declined further testing.  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 Fetal growth restriction with EFW 10% AC 12% normal amniotic fluid and UA Dopplers without evidence of AEDF or REDF. Absent or hypoplastic Midphalynx   The remaining anatomy appeared normal including open hands, normal feet/ heels, intracranial anatomy and face.  I discussed today's findings in detail. [name] recorded the conversation for her significant other. I reviewed the markers of concern, especially in the context of an abnormal NIPT for T18.  I reviewed the recommendation and indication for diagnostic testing.  In addition, we discussed the benefits and risk over the NIPT result. In particular that the NIPT is a screening test not a diagnostic test.  Secondly,  there is a 1:500-1:1000 chance of perinatal loss secondary to vaginal bleeding, membrane ruputure or infection. At this time, [name] declined testing today and would like to speak with her sigificant other. In conveying her response to management options she reported that she would consider termination, however, she would also would like to know even though she is likely to continue the pregnancy.   We discussed that Trisomy 33 is the second most common autosomal trisomy and has a uniformly poor prognosis. Sonography has a high sensitivity for detecting trisomy 18 (64-100%). The incidence is 8:3000-5000, with a female to female ratio of 1:3. Most infants have full trisomy 56. Infants with trisomy 10 present with severe IUGR, microdolichocephaly with a prominent occiput, malformed ears, micrognathia, clenched hands and congenital heart defects (80%) that are  characterized with complex polyvalvular abnormalities with VSD.  Most cases are sporadic, with a 1 in 100 risk of recurrence in future pregnancies. Trisomy 18 is lethal or has a very poor prognosis in all cases. 50% of affected infants will die in the first 2 months of life and 90% will die in the first year. All survivors with full trisomy 18 have profound mental retardation. Failure to thrive occurs in all who survive the immediate neonatal period.  At this time, we will await Ms. Robertson's decision regarding the amniocentesis. If she declines she will be referred for a fetal echocardiogram.  Lastly, we reviewed the [place] law regarding timing of termination of 20 weeks with some facilities going to 22 weeks for certain diagnosis.  All questions answered I spent 45 minutes with > 50% in face to face consultation.

## 2020-03-05 ENCOUNTER — Ambulatory Visit (INDEPENDENT_AMBULATORY_CARE_PROVIDER_SITE_OTHER): Payer: Managed Care, Other (non HMO) | Admitting: Obstetrics and Gynecology

## 2020-03-05 ENCOUNTER — Other Ambulatory Visit (HOSPITAL_COMMUNITY)
Admission: RE | Admit: 2020-03-05 | Discharge: 2020-03-05 | Disposition: A | Discharge status: Home / Self Care | Payer: Managed Care, Other (non HMO) | Source: Ambulatory Visit | Attending: Obstetrics and Gynecology | Admitting: Obstetrics and Gynecology

## 2020-03-05 ENCOUNTER — Encounter: Payer: Self-pay | Admitting: Obstetrics and Gynecology

## 2020-03-05 ENCOUNTER — Other Ambulatory Visit: Payer: Self-pay

## 2020-03-05 VITALS — BP 110/70 | Wt 151.8 lb

## 2020-03-05 DIAGNOSIS — Z202 Contact with and (suspected) exposure to infections with a predominantly sexual mode of transmission: Secondary | ICD-10-CM | POA: Diagnosis not present

## 2020-03-05 DIAGNOSIS — O3512X0 Maternal care for (suspected) chromosomal abnormality in fetus, trisomy 18, not applicable or unspecified: Secondary | ICD-10-CM

## 2020-03-05 DIAGNOSIS — O0990 Supervision of high risk pregnancy, unspecified, unspecified trimester: Secondary | ICD-10-CM

## 2020-03-05 DIAGNOSIS — Z113 Encounter for screening for infections with a predominantly sexual mode of transmission: Secondary | ICD-10-CM

## 2020-03-05 DIAGNOSIS — O351XX0 Maternal care for (suspected) chromosomal abnormality in fetus, not applicable or unspecified: Secondary | ICD-10-CM

## 2020-03-05 DIAGNOSIS — Z3A18 18 weeks gestation of pregnancy: Secondary | ICD-10-CM

## 2020-03-05 MED ORDER — CEFTRIAXONE SODIUM 250 MG IJ SOLR
500.0000 mg | Freq: Once | INTRAMUSCULAR | Status: AC
  Administered 2020-03-05: 500 mg via INTRAMUSCULAR

## 2020-03-05 MED ORDER — AZITHROMYCIN 500 MG PO TABS: 1000.0000 mg | ORAL_TABLET | Freq: Once | ORAL | 0 refills | Status: AC

## 2020-03-05 MED ORDER — AZITHROMYCIN 500 MG PO TABS: 1000.0000 mg | ORAL_TABLET | Freq: Once | ORAL | 0 refills | Status: DC

## 2020-03-05 NOTE — Patient Instructions (Addendum)
Second Trimester of Pregnancy  The second trimester of pregnancy is from week 13 through week 27. This is months 4 through 6 of pregnancy. The second trimester is often a time when you feel your best. Your body has adjusted to being pregnant, and you begin to feel better physically. During the second trimester:  Morning sickness has lessened or stopped completely.  You may have more energy.  You may have an increase in appetite. The second trimester is also a time when the unborn baby (fetus) is growing rapidly. At the end of the sixth month, the fetus may be up to 12 inches long and weigh about 1 pounds. You will likely begin to feel the baby move (quickening) between 16 and 20 weeks of pregnancy. Body changes during your second trimester Your body continues to go through many changes during your second trimester. The changes vary and generally return to normal after the baby is born. Physical changes  Your weight will continue to increase. You will notice your lower abdomen bulging out.  You may begin to get stretch marks on your hips, abdomen, and breasts.  Your breasts will continue to grow and to become tender.  Dark spots or blotches (chloasma or mask of pregnancy) may develop on your face.  A dark line from your belly button to the pubic area (linea nigra) may appear.  You may have changes in your hair. These can include thickening of your hair, rapid growth, and changes in texture. Some people also have hair loss during or after pregnancy, or hair that feels dry or thin. Health changes  You may develop headaches.  You may have heartburn.  You may develop constipation.  You may develop hemorrhoids or swollen, bulging veins (varicose veins).  Your gums may bleed and may be sensitive to brushing and flossing.  You may urinate more often because the fetus is pressing on your bladder.  You may have back pain. This is caused by: ? Weight gain. ? Pregnancy hormones that  are relaxing the joints in your pelvis. ? A shift in weight and the muscles that support your balance. Follow these instructions at home: Medicines  Follow your health care provider's instructions regarding medicine use. Specific medicines may be either safe or unsafe to take during pregnancy. Do not take any medicines unless approved by your health care provider.  Take a prenatal vitamin that contains at least 600 micrograms (mcg) of folic acid. Eating and drinking  Eat a healthy diet that includes fresh fruits and vegetables, whole grains, good sources of protein such as meat, eggs, or tofu, and low-fat dairy products.  Avoid raw meat and unpasteurized juice, milk, and cheese. These carry germs that can harm you and your baby.  You may need to take these actions to prevent or treat constipation: ? Drink enough fluid to keep your urine pale yellow. ? Eat foods that are high in fiber, such as beans, whole grains, and fresh fruits and vegetables. ? Limit foods that are high in fat and processed sugars, such as fried or sweet foods. Activity  Exercise only as directed by your health care provider. Most people can continue their usual exercise routine during pregnancy. Try to exercise for 30 minutes at least 5 days a week. Stop exercising if you develop contractions in your uterus.  Stop exercising if you develop pain or cramping in the lower abdomen or lower back.  Avoid exercising if it is very hot or humid or if you are at  a high altitude.  Avoid heavy lifting.  If you choose to, you may have sex unless your health care provider tells you not to. Relieving pain and discomfort  Wear a supportive bra to prevent discomfort from breast tenderness.  Take warm sitz baths to soothe any pain or discomfort caused by hemorrhoids. Use hemorrhoid cream if your health care provider approves.  Rest with your legs raised (elevated) if you have leg cramps or low back pain.  If you develop  varicose veins: ? Wear support hose as told by your health care provider. ? Elevate your feet for 15 minutes, 3-4 times a day. ? Limit salt in your diet. Safety  Wear your seat belt at all times when driving or riding in a car.  Talk with your health care provider if someone is verbally or physically abusive to you. Lifestyle  Do not use hot tubs, steam rooms, or saunas.  Do not douche. Do not use tampons or scented sanitary pads.  Avoid cat litter boxes and soil used by cats. These carry germs that can cause birth defects in the baby and possibly loss of the fetus by miscarriage or stillbirth.  Do not use herbal remedies, alcohol, illegal drugs, or medicines that are not approved by your health care provider. Chemicals in these products can harm your baby.  Do not use any products that contain nicotine or tobacco, such as cigarettes, e-cigarettes, and chewing tobacco. If you need help quitting, ask your health care provider. General instructions  During a routine prenatal visit, your health care provider will do a physical exam and other tests. He or she will also discuss your overall health. Keep all follow-up visits. This is important.  Ask your health care provider for a referral to a local prenatal education class.  Ask for help if you have counseling or nutritional needs during pregnancy. Your health care provider can offer advice or refer you to specialists for help with various needs. Where to find more information  American Pregnancy Association: americanpregnancy.[place] and Gynecologists: PoolDevices.com.pt  Office on [employer]: KeywordPortfolios.com.br Contact a health care provider if you have:  A headache that does not go away when you take medicine.  Vision changes or you see spots in front of your eyes.  Mild pelvic cramps, pelvic pressure, or nagging pain in the abdominal area.  Persistent nausea,  vomiting, or diarrhea.  A bad-smelling vaginal discharge or foul-smelling urine.  Pain when you urinate.  Sudden or extreme swelling of your face, hands, ankles, feet, or legs.  A fever. Get help right away if you:  Have fluid leaking from your vagina.  Have spotting or bleeding from your vagina.  Have severe abdominal cramping or pain.  Have difficulty breathing.  Have chest pain.  Have fainting spells.  Have not felt your baby move for the time period told by your health care provider.  Have new or increased pain, swelling, or redness in an arm or leg. Summary  The second trimester of pregnancy is from week 13 through week 27 (months 4 through 6).  Do not use herbal remedies, alcohol, illegal drugs, or medicines that are not approved by your health care provider. Chemicals in these products can harm your baby.  Exercise only as directed by your health care provider. Most people can continue their usual exercise routine during pregnancy.  Keep all follow-up visits. This is important. This information is not intended to replace advice given to you by your  health care provider. Make sure you discuss any questions you have with your health care provider. Document Revised: 07/10/2019 Document Reviewed: 05/16/2019 Elsevier Patient Education  2021 [place].   Pregnancy and Vaccinations Vaccines can help to keep you healthy. There are some vaccines that should be given before pregnancy and some that should be given during pregnancy. How does this affect me? If you are pregnant or thinking about getting pregnant, talk with your health care provider about what vaccines are right for you. How does this affect my baby? Usually, the benefits of receiving vaccines during pregnancy outweigh the risks of harm to you or your baby if:  The risk of being exposed to a disease is high.  Infection would pose a risk to you or your unborn baby.  The vaccine is not likely to cause  harm. Vaccines can help protect your baby from some diseases until he or she is old enough to get the vaccine. What can I do to lower my risk? When you receive the recommended vaccines, it helps to protect you from getting certain diseases and passing them on to your baby. Should I receive vaccines before pregnancy? If possible, make sure that your vaccines are up to date before you become pregnant.  It is safe and important for you to receive weakened viral and weakened bacterial vaccines (inactivated vaccines) as needed before you are pregnant.  Live viral and live bacterial vaccines, such as the measles, mumps, and rubella (MMR) vaccine, should be given 1 month or more before pregnancy. Sometimes, women become pregnant within 1 month of receiving a live vaccine that is not usually recommended during pregnancy. The U.S. Centers for Disease Control and Prevention (CDC) has reported that when this has happened, vaccines have not harmed pregnant women or their unborn babies. Should I receive vaccines during pregnancy? It is safe and important for you to receive some inactivated vaccines as needed during pregnancy. Until your baby can receive vaccines, your baby will get some protection from diseases through the vaccines that you receive while you are pregnant. During your pregnancy, you should receive the following:  Influenza vaccine (the flu shot). ? The flu shot may protect you and your baby (up to 38 months of age) from some complications associated with strains of influenza that are covered by the vaccine. ? Pregnant women can receive the flu shot at any time during pregnancy.  Tetanus, diphtheria, and pertussis (Tdap) vaccine. ? The Tdap vaccine will help to prevent whooping cough (pertussis) in you and your baby. ? You should receive 1 dose of this vaccine during each pregnancy. It is recommended that pregnant women receive this vaccine between 27 and 36 weeks of pregnancy.   Should I  receive vaccines after pregnancy?  It is safe and important for you to receive vaccines as needed after pregnancy. Some are safe to have if you are breastfeeding. Other vaccines may not be safe to have until after you have stopped breastfeeding.  If you did not receive the Tdap vaccine during your pregnancy and have never received a Tdap vaccine, you should receive that vaccine right after you give birth to your baby (delivery).  If you are not immune to measles, mumps, rubella, or chickenpox (varicella), you should receive those vaccines within days after delivery.  It is important to talk with your health care provider about what vaccines you may need after delivery. What if I am pregnant and I plan to travel internationally? If you are pregnant and  you are planning to travel internationally, talk with your health care provider at least 4-6 weeks before your trip. Depending on the country you are planning to visit, you may need to take special precautions or get certain vaccines to prevent disease. Vaccines that may be recommended for pregnant international travelers include:  Influenza (the flu shot).  Tetanus and diphtheria (Td) or Tdap.  Hepatitis B (HepB).  Hepatitis A (HepA). Your health care provider can help you decide if you need vaccines and if the benefits outweigh the risk of disease exposure. Follow these instructions at home:  Take over-the-counter and prescription medicines as told by your health care provider.  Keep all follow-up visits as told by your health care provider. This is important. Questions to ask your health care provider:  What vaccines are safe during pregnancy?  What are the risks of vaccines during pregnancy?  What are the potential side effects of vaccines during or after pregnancy?  When should I get vaccines during pregnancy? Contact a health care provider if you:  Believe you have had a reaction to a vaccine.  Have concerns or questions  about a vaccine.  Become pregnant within 1 month after you have received a live vaccine. Summary  Vaccines are the most effective way to prevent certain diseases.  Many vaccines are safe to receive during pregnancy.  Some vaccines are recommended during pregnancy to protect you and your baby from getting sick.  If you are pregnant or planning to become pregnant, talk with your health care provider about what vaccines are right for you. This information is not intended to replace advice given to you by your health care provider. Make sure you discuss any questions you have with your health care provider. Document Revised: 07/10/2019 Document Reviewed: 03/07/2018 Elsevier Patient Education  2021 [employer].

## 2020-03-05 NOTE — Progress Notes (Signed)
Routine Prenatal Care Visit  Subjective  Cheryl Macias is a 45 y.o. G3P1011 at [redacted]w[redacted]d being seen today for ongoing prenatal care.  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  ----------------------------------------------------------------------------------- Patient reports no complaints.  Reports the FOB tested positive for gonorrhea. She would like to be tested for infection as well.  Contractions: Not present. Vag. Bleeding: None.  Movement: Present. Denies leaking of fluid.  ----------------------------------------------------------------------------------- The following portions of the patient's history were reviewed and updated as appropriate: allergies, current medications, past family history, past medical history, past social history, past surgical history and problem list. Problem list updated.   Objective  Blood pressure 110/70, weight 151 lb 12.8 oz (68.9 kg), last menstrual period 10/30/2019. Pregravid weight 142 lb (64.4 kg) Total Weight Gain 9 lb 12.8 oz (4.445 kg) Urinalysis:      Fetal Status: Fetal Heart Rate (bpm): 150   Movement: Present     General:  Alert, oriented and cooperative. Patient is in no acute distress.  Skin: Skin is warm and dry. No rash noted.   Cardiovascular: Normal heart rate noted  Respiratory: Normal respiratory effort, no problems with respiration noted  Abdomen: Soft, gravid, appropriate for gestational age. Pain/Pressure: Absent     Pelvic:  Cervical exam deferred        Extremities: Normal range of motion.  Edema: None  Mental Status: Normal mood and affect. Normal behavior. Normal judgment and thought content.     Assessment   45 y.o. 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 03/03/2020 by Jaynie Collins, MD No   Abnormal genetic test during pregnancy 02/06/2020 by Rod Can, CNM No   Supervision of high risk pregnancy, antepartum 12/27/2019 by Rod Can, CNM No   Overview Addendum 03/05/2020  8:54 AM by Homero Fellers, MD     Nursing Staff Provider  Office Location  [facility] Dating   LMP = 10 wk [place]  Language  English Anatomy US   complete  Flu Vaccine  Declined Genetic Screen  NIPS:   T18  TDaP vaccine    Hgb A1C or  GTT Third trimester :   Rhogam   not needed   LAB RESULTS   Feeding Plan  Breast Blood Type O/Positive/-- (11/24 1550)   Contraception  Antibody Negative (11/24 1550)  Circumcision  Rubella 1.96 (11/24 1550)  Pediatrician   RPR Non Reactive (11/24 1550)   Support Person   Fiance Paul   HBsAg Negative (11/24 1550)   Prenatal Classes  HIV Non Reactive (11/24 1550)    Varicella immune  BTL Consent  GBS  (For PCN allergy, check sensitivities)        VBAC Consent   Pap  NIL 2021    Hgb Electro   normal  COVID unvaccinated CF      SMA          High risk pregnancy Diagnoses Advanced Maternal Age Trisomy 18 on NIPS Anemia in pregnancy Gonorrhea exposure in pregnancy 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  Discussed trisomy 18 diagnosis. She is considering amniocentesis. She feels like it might help her peace of mind but her partner is opposed.  She plans on continuing the pregnancy at this time.  Reports the FOB tested positive for gonorrhea. Will treat presumptively for gonorrhea and chlamydia-  TOC in 1 month if labs are positive. Rx for azithomycin sent to pharmacy.   Gestational age  appropriate obstetric precautions including but not limited to vaginal bleeding, contractions, leaking of fluid and fetal movement were reviewed in detail with the patient.    Return in about 2 weeks (around 03/19/2020) for [place] with MD.  Homero Fellers MD [place], [place] 03/05/2020, 8:55 AM

## 2020-03-05 NOTE — Progress Notes (Signed)
Anal bleeding x 5 days. (heavy when she has a bowel movement.

## 2020-03-06 ENCOUNTER — Encounter: Payer: Self-pay | Admitting: Obstetrics and Gynecology

## 2020-03-06 LAB — URINE CYTOLOGY ANCILLARY ONLY
Chlamydia: NEGATIVE
Comment: NEGATIVE
Comment: NEGATIVE
Comment: NORMAL
Neisseria Gonorrhea: POSITIVE — AB
Trichomonas: NEGATIVE

## 2020-03-12 ENCOUNTER — Telehealth: Payer: Self-pay | Admitting: Obstetrics and Gynecology

## 2020-03-12 NOTE — Telephone Encounter (Signed)
I spoke with [name] to follow up on her prior visit regarding prenatal diagnosis.  She would like to decline amniocentesis at this time and knows it can be an option later in pregnancy if desired.  We will send the referral for a fetal echo at [redacted] weeks gestation and will see her back here at her scheduled follow up visit.  Wilburt Finlay, MS, CGC

## 2020-03-18 ENCOUNTER — Other Ambulatory Visit: Payer: Self-pay | Admitting: Advanced Practice Midwife

## 2020-03-18 DIAGNOSIS — O09522 Supervision of elderly multigravida, second trimester: Secondary | ICD-10-CM

## 2020-03-18 DIAGNOSIS — O280 Abnormal hematological finding on antenatal screening of mother: Secondary | ICD-10-CM

## 2020-03-19 ENCOUNTER — Encounter: Payer: Managed Care, Other (non HMO) | Admitting: Obstetrics and Gynecology

## 2020-03-19 ENCOUNTER — Other Ambulatory Visit: Payer: Self-pay | Admitting: Advanced Practice Midwife

## 2020-03-19 DIAGNOSIS — O09522 Supervision of elderly multigravida, second trimester: Secondary | ICD-10-CM

## 2020-03-19 DIAGNOSIS — O280 Abnormal hematological finding on antenatal screening of mother: Secondary | ICD-10-CM

## 2020-03-24 ENCOUNTER — Other Ambulatory Visit: Payer: Self-pay

## 2020-03-24 ENCOUNTER — Ambulatory Visit: Payer: Managed Care, Other (non HMO) | Attending: Obstetrics

## 2020-03-24 DIAGNOSIS — O350XX0 Maternal care for (suspected) central nervous system malformation in fetus, not applicable or unspecified: Secondary | ICD-10-CM

## 2020-03-24 DIAGNOSIS — O280 Abnormal hematological finding on antenatal screening of mother: Secondary | ICD-10-CM | POA: Diagnosis not present

## 2020-03-24 DIAGNOSIS — O358XX0 Maternal care for other (suspected) fetal abnormality and damage, not applicable or unspecified: Secondary | ICD-10-CM

## 2020-03-24 DIAGNOSIS — O365920 Maternal care for other known or suspected poor fetal growth, second trimester, not applicable or unspecified: Secondary | ICD-10-CM

## 2020-03-24 DIAGNOSIS — O09522 Supervision of elderly multigravida, second trimester: Secondary | ICD-10-CM | POA: Diagnosis present

## 2020-03-24 DIAGNOSIS — O0990 Supervision of high risk pregnancy, unspecified, unspecified trimester: Secondary | ICD-10-CM

## 2020-03-24 DIAGNOSIS — O402XX0 Polyhydramnios, second trimester, not applicable or unspecified: Secondary | ICD-10-CM | POA: Diagnosis not present

## 2020-03-24 DIAGNOSIS — O351XX0 Maternal care for (suspected) chromosomal abnormality in fetus, not applicable or unspecified: Secondary | ICD-10-CM | POA: Diagnosis not present

## 2020-03-24 DIAGNOSIS — Z3A20 20 weeks gestation of pregnancy: Secondary | ICD-10-CM | POA: Insufficient documentation

## 2020-03-24 DIAGNOSIS — O3512X0 Maternal care for (suspected) chromosomal abnormality in fetus, trisomy 18, not applicable or unspecified: Secondary | ICD-10-CM

## 2020-03-24 DIAGNOSIS — O285 Abnormal chromosomal and genetic finding on antenatal screening of mother: Secondary | ICD-10-CM

## 2020-04-02 ENCOUNTER — Encounter: Payer: Self-pay | Admitting: Obstetrics & Gynecology

## 2020-04-02 ENCOUNTER — Other Ambulatory Visit: Payer: Self-pay

## 2020-04-02 ENCOUNTER — Other Ambulatory Visit (HOSPITAL_COMMUNITY)
Admission: RE | Admit: 2020-04-02 | Discharge: 2020-04-02 | Disposition: A | Discharge status: Home / Self Care | Payer: Managed Care, Other (non HMO) | Source: Ambulatory Visit | Attending: Obstetrics & Gynecology | Admitting: Obstetrics & Gynecology

## 2020-04-02 ENCOUNTER — Ambulatory Visit (INDEPENDENT_AMBULATORY_CARE_PROVIDER_SITE_OTHER): Payer: Managed Care, Other (non HMO) | Admitting: Obstetrics & Gynecology

## 2020-04-02 VITALS — BP 110/70 | Wt 157.0 lb

## 2020-04-02 DIAGNOSIS — Z3A22 22 weeks gestation of pregnancy: Secondary | ICD-10-CM

## 2020-04-02 DIAGNOSIS — O351XX0 Maternal care for (suspected) chromosomal abnormality in fetus, not applicable or unspecified: Secondary | ICD-10-CM

## 2020-04-02 DIAGNOSIS — O0990 Supervision of high risk pregnancy, unspecified, unspecified trimester: Secondary | ICD-10-CM

## 2020-04-02 DIAGNOSIS — O3512X0 Maternal care for (suspected) chromosomal abnormality in fetus, trisomy 18, not applicable or unspecified: Secondary | ICD-10-CM

## 2020-04-02 DIAGNOSIS — O0992 Supervision of high risk pregnancy, unspecified, second trimester: Secondary | ICD-10-CM | POA: Diagnosis present

## 2020-04-02 DIAGNOSIS — Z8619 Personal history of other infectious and parasitic diseases: Secondary | ICD-10-CM | POA: Insufficient documentation

## 2020-04-02 DIAGNOSIS — Z113 Encounter for screening for infections with a predominantly sexual mode of transmission: Secondary | ICD-10-CM | POA: Diagnosis not present

## 2020-04-02 LAB — POCT URINALYSIS DIPSTICK OB: Glucose, UA: NEGATIVE

## 2020-04-02 NOTE — Addendum Note (Signed)
Addended by: Quintella Baton D on: 04/02/2020 09:19 AM   Modules accepted: Orders

## 2020-04-02 NOTE — Progress Notes (Signed)
  Subjective  Fetal Movement? yes Contractions? no Leaking Fluid? no Vaginal Bleeding? no GERD yes, no nausea  Objective  BP 110/70   Wt 157 lb (71.2 kg)   LMP 10/30/2019 (Exact Date)   BMI 26.95 kg/m  General: NAD Pumonary: no increased work of breathing Abdomen: gravid, non-tender Extremities: no edema Psychiatric: mood appropriate, affect full  Assessment  45 y.o. 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 03/03/2020 by Jaynie Collins, MD No   Abnormal genetic test during pregnancy 02/06/2020 by Rod Can, CNM No   Supervision of high risk pregnancy, antepartum 12/27/2019 by Rod Can, CNM No   Overview Addendum 03/06/2020  2:56 PM by Homero Fellers, MD     Nursing Staff Provider  Office Location  [facility] Dating   LMP = 10 wk [place]  Language  English Anatomy US   complete  Flu Vaccine  Declined Genetic Screen  NIPS:   T18  TDaP vaccine    Hgb A1C or  GTT Third trimester :   Rhogam   not needed   LAB RESULTS   Feeding Plan  Breast Blood Type O/Positive/-- (11/24 1550)   Contraception  Antibody Negative (11/24 1550)  Circumcision  Rubella 1.96 (11/24 1550)  Pediatrician   RPR Non Reactive (11/24 1550)   Support Person   Fiance Paul   HBsAg Negative (11/24 1550)   Prenatal Classes  HIV Non Reactive (11/24 1550)    Varicella immune  BTL Consent  GBS  (For PCN allergy, check sensitivities)        VBAC Consent   Pap  NIL 2021    Hgb Electro   normal  COVID unvaccinated CF      SMA          High risk pregnancy Diagnoses Advanced Maternal Age Trisomy 15 on NIPS Anemia in pregnancy Gonorrhea in pregnancy- TOC needed February 2022 [done today ] 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

## 2020-04-03 LAB — GC/CHLAMYDIA PROBE AMP (~~LOC~~) NOT AT ARMC
Chlamydia: NEGATIVE
Comment: NEGATIVE
Comment: NORMAL
Neisseria Gonorrhea: NEGATIVE

## 2020-04-03 NOTE — Telephone Encounter (Signed)
Please advise 

## 2020-04-08 ENCOUNTER — Encounter: Payer: Self-pay | Admitting: Pediatric Cardiology

## 2020-04-16 ENCOUNTER — Other Ambulatory Visit: Payer: Self-pay | Admitting: Advanced Practice Midwife

## 2020-04-16 DIAGNOSIS — O09522 Supervision of elderly multigravida, second trimester: Secondary | ICD-10-CM

## 2020-04-21 ENCOUNTER — Other Ambulatory Visit: Payer: Self-pay

## 2020-04-21 ENCOUNTER — Ambulatory Visit: Payer: Managed Care, Other (non HMO) | Attending: Obstetrics and Gynecology

## 2020-04-21 ENCOUNTER — Other Ambulatory Visit: Payer: Self-pay | Admitting: Advanced Practice Midwife

## 2020-04-21 ENCOUNTER — Ambulatory Visit (HOSPITAL_BASED_OUTPATIENT_CLINIC_OR_DEPARTMENT_OTHER): Payer: Managed Care, Other (non HMO) | Admitting: Obstetrics and Gynecology

## 2020-04-21 DIAGNOSIS — O09522 Supervision of elderly multigravida, second trimester: Secondary | ICD-10-CM

## 2020-04-21 DIAGNOSIS — O351XX0 Maternal care for (suspected) chromosomal abnormality in fetus, not applicable or unspecified: Secondary | ICD-10-CM

## 2020-04-21 DIAGNOSIS — O365920 Maternal care for other known or suspected poor fetal growth, second trimester, not applicable or unspecified: Secondary | ICD-10-CM | POA: Diagnosis not present

## 2020-04-21 DIAGNOSIS — O3512X0 Maternal care for (suspected) chromosomal abnormality in fetus, trisomy 18, not applicable or unspecified: Secondary | ICD-10-CM

## 2020-04-21 DIAGNOSIS — Z3A24 24 weeks gestation of pregnancy: Secondary | ICD-10-CM

## 2020-04-21 DIAGNOSIS — O285 Abnormal chromosomal and genetic finding on antenatal screening of mother: Secondary | ICD-10-CM

## 2020-04-21 DIAGNOSIS — O0990 Supervision of high risk pregnancy, unspecified, unspecified trimester: Secondary | ICD-10-CM

## 2020-04-21 DIAGNOSIS — O359XX0 Maternal care for (suspected) fetal abnormality and damage, unspecified, not applicable or unspecified: Secondary | ICD-10-CM | POA: Diagnosis not present

## 2020-04-21 DIAGNOSIS — O358XX0 Maternal care for other (suspected) fetal abnormality and damage, not applicable or unspecified: Secondary | ICD-10-CM | POA: Diagnosis not present

## 2020-04-21 NOTE — Progress Notes (Unsigned)
Maternal-Fetal Medicine (Follow-up Consultation)  Name: Cheryl Macias DOB: [birth year] MRN: [id number] Referring Provider: Rod Can, CNM  I had the pleasure of seeing [name] today at [facility], [facility]. She is G3 P2022 at 24w 6d gestation. She is here for ultrasound and follow-up consultation.  Cell-free fetal DNA screening showed increased risk for trisomy 18. Patient had genetic counseling and had opted not to have amniocentesis.  On fetal anatomy scan, single umbilical artery, marginal cord insertion and fetal growth restriction were seen. Fetal echocardiography showed double-outlet right ventricle, large subaortic VSD, dysplastic mitral vale with thickened leaflets and mild hypoplastic left ventricle with normal systolic function.  Ultrasound Amniotic fluid is normal and good fetal activity is seen. "Strawberry-shaped" (consistent with trisomy 53) is seen. The estimated fetal weight is at the 1st percentile. Umbilical artery Doppler showed normal forward diastolic flow. Hands were seen (clenched, not open). Cardiac anatomy was not evaluated in detail. No pericardial effusion is seen.  I counseled the patient on the following: Suspected Fetal Trisomy 18 -Cell-free fetal DNA screening has a greater detection for trisomy 18 as against conventional screening. However, it is not confirmatory. Only amniocentesis will give a definitive result on the fetal karyotype.  -Amniocentesis can be safely performed and the risk of miscarriage in a chromosomally normal fetus is about 1 in 500 procedures. However, the risk of fetal demise/preterm delivery is higher in chromosomally abnormal fetuses that is independent of amniocentesis procedure.  -Explained the finding of fetal growth restriction and the most-likely cause of fetal aneuploidy.  -Ultrasound features and abnormal NIPT results strongly raise the possibility of fetal trisomy 18.  -Patient is aware that cardiac surgery may  not be performed on infants with trisomy 18.  -I counseled the patient that in pregnancies complicated by fetal growth restriction, frequent fetal monitoring (once weekly) is recommended. However, we do not recommend frequent monitoring in pregnancy with trisomy 38.   -Abnormal antenatal testing is likely to warrant cesarean delivery. Given that postnatal outcome is very poor in fetuses with trisomy 35, patient was made to understand the maternal risks involved in cesarean delivery.  -I discussed the option of not having intrapartum monitoring so that cesarean delivery for non-reassuring fetal heart trace may be avoided. Non-reassuring fetal heart trace is more common in aneuploid fetuses.  I counseled the patient to consider the options: -Follow-up scan in 3 to 4 weeks and opt not to have fetal testing including BPP/Doppler studies as needed. -Avoid intrapartum monitoring. -Consider amniocentesis for a definitive result that will help her to choose the best option for her. -In the absence of confirmatory diagnosis of trisomy 18, the patient has the option of having fetal monitoring that would be recommended in a chromosomally normal fetus.  After counseling, the patient opted to return in 3 weeks for fetal growth assessment. She will discuss with her husband and decide on the options.   Thank you for consultation. Please contact me if you have any questions.  Consultation including face-to-face counseling 30 minutes.

## 2020-04-21 NOTE — Progress Notes (Signed)
Maternal-Fetal Medicine (Follow-up Consultation)  Name: Cheryl Macias DOB: [birth year] MRN: [id number] Referring Provider: Rod Can, CNM  I had the pleasure of seeing Cheryl Macias today at [facility]. She is G3 P2022 at 24w 6d gestation. She is here for ultrasound and follow-up consultation.  Cell-free fetal DNA screening showed increased risk for trisomy 18. Patient had genetic counseling and had opted not to have amniocentesis.  On fetal anatomy scan, single umbilical artery, marginal cord insertion and fetal growth restriction were seen. Fetal echocardiography showed double-outlet right ventricle, large subaortic VSD, dysplastic mitral vale with thickened leaflets and mild hypoplastic left ventricle with normal systolic function.  Ultrasound Amniotic fluid is normal and good fetal activity is seen. Strawberry-shaped (consistent with trisomy 60) is seen. The estimated fetal weight is at the 1st percentile. Umbilical artery Doppler showed normal forward diastolic flow. Hands were seen (clenched, not open). Cardiac anatomy was not evaluated in detail. No pericardial effusion is seen.  I counseled the patient on the following: Suspected Fetal Trisomy 18 -Cell-free fetal DNA screening has a greater detection for trisomy 18 as against conventional screening. However, it is not confirmatory. Only amniocentesis will give a definitive result on the fetal karyotype. -Amniocentesis can be safely performed and the risk of miscarriage in a chromosomally normal fetus is about 1 in 500 procedures. However, the risk of fetal demise/preterm delivery is higher in chromosomally abnormal fetuses that is independent of amniocentesis procedure.  -Explained the finding of fetal growth restriction and the most-likely cause of fetal aneuploidy.  -Ultrasound features and abnormal NIPT results strongly raise the possibility of fetal trisomy 18.  -Patient is aware that cardiac surgery may not  be performed on infants with trisomy 18.  -I counseled the patient that in pregnancies complicated by fetal growth restriction, frequent fetal monitoring (once weekly) is recommended. However, we do not recommend frequent monitoring in pregnancy with trisomy 79.  -Abnormal antenatal testing is likely to warrant cesarean delivery. Given that postnatal outcome is very poor in fetuses with trisomy 64, patient was made to understand the maternal risks involved in cesarean delivery. -I discussed the option of not having intrapartum monitoring so that cesarean delivery for non-reassuring fetal heart trace may be avoided. Non-reassuring fetal heart trace is more common in aneuploid fetuses.  I counseled the patient to consider the options: -Follow-up scan in 3 to 4 weeks and opt not to have fetal testing including BPP/Doppler studies as needed. -Avoid intrapartum monitoring. -Consider amniocentesis for a definitive result that will help her to choose the best option for her. -In the absence of confirmatory diagnosis of trisomy 18, the patient has the option of having fetal monitoring that would be recommended in a chromosomally normal fetus.  After counseling, the patient opted to return in 3 weeks for fetal growth assessment. She will discuss with her husband and decide on the options.   Thank you for consultation. Please contact me if you have any questions.  Consultation including face-to-face counseling 30 minutes.

## 2020-05-02 ENCOUNTER — Other Ambulatory Visit: Payer: Self-pay | Admitting: Advanced Practice Midwife

## 2020-05-02 DIAGNOSIS — O351XX0 Maternal care for (suspected) chromosomal abnormality in fetus, not applicable or unspecified: Secondary | ICD-10-CM

## 2020-05-02 DIAGNOSIS — O35BXX0 Maternal care for other (suspected) fetal abnormality and damage, fetal cardiac anomalies, not applicable or unspecified: Secondary | ICD-10-CM

## 2020-05-02 DIAGNOSIS — O359XX0 Maternal care for (suspected) fetal abnormality and damage, unspecified, not applicable or unspecified: Secondary | ICD-10-CM

## 2020-05-02 DIAGNOSIS — O3512X0 Maternal care for (suspected) chromosomal abnormality in fetus, trisomy 18, not applicable or unspecified: Secondary | ICD-10-CM

## 2020-05-02 DIAGNOSIS — O09522 Supervision of elderly multigravida, second trimester: Secondary | ICD-10-CM

## 2020-05-02 DIAGNOSIS — O365921 Maternal care for other known or suspected poor fetal growth, second trimester, fetus 1: Secondary | ICD-10-CM

## 2020-05-02 DIAGNOSIS — O358XX0 Maternal care for other (suspected) fetal abnormality and damage, not applicable or unspecified: Secondary | ICD-10-CM

## 2020-05-04 ENCOUNTER — Ambulatory Visit (INDEPENDENT_AMBULATORY_CARE_PROVIDER_SITE_OTHER): Payer: Managed Care, Other (non HMO) | Admitting: Obstetrics & Gynecology

## 2020-05-04 ENCOUNTER — Encounter: Payer: Self-pay | Admitting: Obstetrics & Gynecology

## 2020-05-04 ENCOUNTER — Other Ambulatory Visit: Payer: Self-pay

## 2020-05-04 VITALS — BP 110/70 | Wt 162.0 lb

## 2020-05-04 DIAGNOSIS — O0992 Supervision of high risk pregnancy, unspecified, second trimester: Secondary | ICD-10-CM

## 2020-05-04 DIAGNOSIS — Z3A26 26 weeks gestation of pregnancy: Secondary | ICD-10-CM

## 2020-05-04 DIAGNOSIS — O351XX0 Maternal care for (suspected) chromosomal abnormality in fetus, not applicable or unspecified: Secondary | ICD-10-CM

## 2020-05-04 DIAGNOSIS — Z131 Encounter for screening for diabetes mellitus: Secondary | ICD-10-CM

## 2020-05-04 DIAGNOSIS — O3512X0 Maternal care for (suspected) chromosomal abnormality in fetus, trisomy 18, not applicable or unspecified: Secondary | ICD-10-CM

## 2020-05-04 LAB — POCT URINALYSIS DIPSTICK OB
Glucose, UA: NEGATIVE
POC,PROTEIN,UA: NEGATIVE

## 2020-05-04 NOTE — Progress Notes (Signed)
  Subjective  Fetal Movement? yes Contractions? no Leaking Fluid? no Vaginal Bleeding? no Pt doing well, feels concerned about Trisomy 18 and delivery anticipation, but otherwise is mentally well  Objective  BP 110/70   Wt 162 lb (73.5 kg)   LMP 10/30/2019 (Exact Date)   BMI 27.81 kg/m  General: NAD Pumonary: no increased work of breathing Abdomen: gravid, non-tender Extremities: no edema Psychiatric: mood appropriate, affect full FHT 140s Assessment  45 y.o. 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    Other Visit Diagnoses    [redacted] weeks gestation of pregnancy    -  Primary   Screening for diabetes mellitus       Relevant Orders   28 Week RH+Panel        Nursing Staff Provider  Office Location  [facility] Dating   LMP = 10 wk [place]  Language  English Anatomy US   complete  Flu Vaccine  Declined Genetic Screen  NIPS:   T18  TDaP vaccine    Hgb A1C or  GTT Third trimester :   Rhogam   not needed   LAB RESULTS   Feeding Plan  Breast Blood Type O/Positive/-- (11/24 1550)   Contraception  Antibody Negative (11/24 1550)  Circumcision  Rubella 1.96 (11/24 1550)  Pediatrician   RPR Non Reactive (11/24 1550)   Support Person   Fiance Paul   HBsAg Negative (11/24 1550)   Prenatal Classes  HIV Non Reactive (11/24 1550)    Varicella immune  BTL Consent  GBS  (For PCN allergy, check sensitivities)        VBAC Consent   Pap  NIL 2021    Hgb Electro   normal  COVID unvaccinated CF      SMA         High risk pregnancy Diagnoses Advanced Maternal Age Trisomy 41 on NIPS Anemia in pregnancy Gonorrhea in pregnancy- TOC needed February 2022 [done, neg ] 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

## 2020-05-04 NOTE — Addendum Note (Signed)
Addended by: Quintella Baton D on: 05/04/2020 11:43 AM   Modules accepted: Orders

## 2020-05-12 ENCOUNTER — Ambulatory Visit: Payer: Managed Care, Other (non HMO) | Attending: Maternal & Fetal Medicine

## 2020-05-12 ENCOUNTER — Other Ambulatory Visit: Payer: Self-pay

## 2020-05-12 DIAGNOSIS — Z3A27 27 weeks gestation of pregnancy: Secondary | ICD-10-CM

## 2020-05-12 DIAGNOSIS — O351XX0 Maternal care for (suspected) chromosomal abnormality in fetus, not applicable or unspecified: Secondary | ICD-10-CM

## 2020-05-12 DIAGNOSIS — O365920 Maternal care for other known or suspected poor fetal growth, second trimester, not applicable or unspecified: Secondary | ICD-10-CM

## 2020-05-12 DIAGNOSIS — O3512X0 Maternal care for (suspected) chromosomal abnormality in fetus, trisomy 18, not applicable or unspecified: Secondary | ICD-10-CM

## 2020-05-12 DIAGNOSIS — O0990 Supervision of high risk pregnancy, unspecified, unspecified trimester: Secondary | ICD-10-CM

## 2020-05-12 DIAGNOSIS — O409XX0 Polyhydramnios, unspecified trimester, not applicable or unspecified: Secondary | ICD-10-CM | POA: Diagnosis not present

## 2020-05-12 DIAGNOSIS — O09522 Supervision of elderly multigravida, second trimester: Secondary | ICD-10-CM | POA: Diagnosis not present

## 2020-05-12 DIAGNOSIS — O359XX0 Maternal care for (suspected) fetal abnormality and damage, unspecified, not applicable or unspecified: Secondary | ICD-10-CM

## 2020-05-12 DIAGNOSIS — O365921 Maternal care for other known or suspected poor fetal growth, second trimester, fetus 1: Secondary | ICD-10-CM

## 2020-05-12 DIAGNOSIS — O285 Abnormal chromosomal and genetic finding on antenatal screening of mother: Secondary | ICD-10-CM

## 2020-05-12 DIAGNOSIS — O09523 Supervision of elderly multigravida, third trimester: Secondary | ICD-10-CM | POA: Diagnosis not present

## 2020-05-12 DIAGNOSIS — O35BXX0 Maternal care for other (suspected) fetal abnormality and damage, fetal cardiac anomalies, not applicable or unspecified: Secondary | ICD-10-CM

## 2020-05-12 DIAGNOSIS — O358XX0 Maternal care for other (suspected) fetal abnormality and damage, not applicable or unspecified: Secondary | ICD-10-CM

## 2020-05-18 ENCOUNTER — Other Ambulatory Visit: Payer: Self-pay

## 2020-05-18 ENCOUNTER — Ambulatory Visit (INDEPENDENT_AMBULATORY_CARE_PROVIDER_SITE_OTHER): Payer: Managed Care, Other (non HMO) | Admitting: Obstetrics & Gynecology

## 2020-05-18 ENCOUNTER — Other Ambulatory Visit: Payer: Managed Care, Other (non HMO)

## 2020-05-18 ENCOUNTER — Encounter: Payer: Self-pay | Admitting: Obstetrics & Gynecology

## 2020-05-18 VITALS — BP 100/60 | Wt 167.0 lb

## 2020-05-18 DIAGNOSIS — O3512X0 Maternal care for (suspected) chromosomal abnormality in fetus, trisomy 18, not applicable or unspecified: Secondary | ICD-10-CM

## 2020-05-18 DIAGNOSIS — Z131 Encounter for screening for diabetes mellitus: Secondary | ICD-10-CM

## 2020-05-18 DIAGNOSIS — O351XX0 Maternal care for (suspected) chromosomal abnormality in fetus, not applicable or unspecified: Secondary | ICD-10-CM

## 2020-05-18 DIAGNOSIS — O0992 Supervision of high risk pregnancy, unspecified, second trimester: Secondary | ICD-10-CM

## 2020-05-18 DIAGNOSIS — Z3A28 28 weeks gestation of pregnancy: Secondary | ICD-10-CM

## 2020-05-18 NOTE — Patient Instructions (Signed)

## 2020-05-18 NOTE — Progress Notes (Signed)
  Subjective  Fetal Movement? yes Contractions? no Leaking Fluid? no Vaginal Bleeding? No  Seen at MFM w [place] last week    IUGR, Polyhydramnios, Tri18  Objective  BP 100/60   Wt 167 lb (75.8 kg)   LMP 10/30/2019 (Exact Date)   BMI 28.67 kg/m  General: NAD Pumonary: no increased work of breathing Abdomen: gravid, non-tender Extremities: no edema Psychiatric: mood appropriate, affect full  Assessment  45 y.o. 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     Abnormal genetic test during pregnancy     Supervision of high risk pregnancy, antepartum     Overview Addendum 03/06/2020  2:56 PM by Homero Fellers, MD     Nursing Staff Provider  Office Location  [facility] Dating   LMP = 10 wk [place]  Language  English Anatomy US   complete  Flu Vaccine  Declined Genetic Screen  NIPS:   T18  TDaP vaccine    Hgb A1C or  GTT Third trimester :   Rhogam   not needed   LAB RESULTS   Feeding Plan  Breast Blood Type O/Positive/-- (11/24 1550)   Contraception  Antibody Negative (11/24 1550)  Circumcision  Rubella 1.96 (11/24 1550)  Pediatrician   RPR Non Reactive (11/24 1550)   Support Person   Fiance Paul   HBsAg Negative (11/24 1550)   Prenatal Classes  HIV Non Reactive (11/24 1550)    Varicella immune  BTL Consent  GBS  (For PCN allergy, check sensitivities)        VBAC Consent   Pap  NIL 2021    Hgb Electro   normal  COVID unvaccinated CF      SMA          High risk pregnancy Diagnoses Advanced Maternal Age Trisomy 57 on NIPS Anemia in pregnancy Gonorrhea in  pregnancy- TOC needed February 2022 [ ]  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

## 2020-05-19 LAB — 28 WEEK RH+PANEL
Basophils Absolute: 0 10*3/uL (ref 0.0–0.2)
Basos: 0 %
EOS (ABSOLUTE): 0.1 10*3/uL (ref 0.0–0.4)
Eos: 2 %
Gestational Diabetes Screen: 120 mg/dL (ref 65–139)
HIV Screen 4th Generation wRfx: NONREACTIVE
Hematocrit: 33 % — ABNORMAL LOW (ref 34.0–46.6)
Hemoglobin: 10.8 g/dL — ABNORMAL LOW (ref 11.1–15.9)
Immature Grans (Abs): 0 10*3/uL (ref 0.0–0.1)
Immature Granulocytes: 0 %
Lymphocytes Absolute: 1.2 10*3/uL (ref 0.7–3.1)
Lymphs: 22 %
MCH: 27.6 pg (ref 26.6–33.0)
MCHC: 32.7 g/dL (ref 31.5–35.7)
MCV: 84 fL (ref 79–97)
Monocytes Absolute: 0.6 10*3/uL (ref 0.1–0.9)
Monocytes: 11 %
Neutrophils Absolute: 3.6 10*3/uL (ref 1.4–7.0)
Neutrophils: 65 %
Platelets: 238 10*3/uL (ref 150–450)
RBC: 3.92 x10E6/uL (ref 3.77–5.28)
RDW: 14.2 % (ref 11.7–15.4)
RPR Ser Ql: NONREACTIVE
WBC: 5.5 10*3/uL (ref 3.4–10.8)

## 2020-05-28 ENCOUNTER — Other Ambulatory Visit: Payer: Self-pay | Admitting: Advanced Practice Midwife

## 2020-05-28 DIAGNOSIS — O09523 Supervision of elderly multigravida, third trimester: Secondary | ICD-10-CM

## 2020-05-28 DIAGNOSIS — O359XX0 Maternal care for (suspected) fetal abnormality and damage, unspecified, not applicable or unspecified: Secondary | ICD-10-CM

## 2020-05-28 DIAGNOSIS — O365931 Maternal care for other known or suspected poor fetal growth, third trimester, fetus 1: Secondary | ICD-10-CM

## 2020-05-28 DIAGNOSIS — O3512X0 Maternal care for (suspected) chromosomal abnormality in fetus, trisomy 18, not applicable or unspecified: Secondary | ICD-10-CM

## 2020-05-28 DIAGNOSIS — O351XX0 Maternal care for (suspected) chromosomal abnormality in fetus, not applicable or unspecified: Secondary | ICD-10-CM

## 2020-06-01 ENCOUNTER — Encounter: Payer: Managed Care, Other (non HMO) | Admitting: Obstetrics and Gynecology

## 2020-06-02 ENCOUNTER — Encounter: Payer: Self-pay | Admitting: Obstetrics and Gynecology

## 2020-06-02 ENCOUNTER — Ambulatory Visit (INDEPENDENT_AMBULATORY_CARE_PROVIDER_SITE_OTHER): Payer: Managed Care, Other (non HMO) | Admitting: Obstetrics and Gynecology

## 2020-06-02 ENCOUNTER — Other Ambulatory Visit: Payer: Self-pay

## 2020-06-02 VITALS — BP 100/70 | Ht 64.0 in | Wt 166.4 lb

## 2020-06-02 DIAGNOSIS — O09522 Supervision of elderly multigravida, second trimester: Secondary | ICD-10-CM

## 2020-06-02 DIAGNOSIS — O3512X0 Maternal care for (suspected) chromosomal abnormality in fetus, trisomy 18, not applicable or unspecified: Secondary | ICD-10-CM

## 2020-06-02 DIAGNOSIS — O09523 Supervision of elderly multigravida, third trimester: Secondary | ICD-10-CM | POA: Diagnosis not present

## 2020-06-02 DIAGNOSIS — Z3A30 30 weeks gestation of pregnancy: Secondary | ICD-10-CM

## 2020-06-02 DIAGNOSIS — O285 Abnormal chromosomal and genetic finding on antenatal screening of mother: Secondary | ICD-10-CM

## 2020-06-02 DIAGNOSIS — O0993 Supervision of high risk pregnancy, unspecified, third trimester: Secondary | ICD-10-CM

## 2020-06-02 DIAGNOSIS — O0992 Supervision of high risk pregnancy, unspecified, second trimester: Secondary | ICD-10-CM

## 2020-06-02 DIAGNOSIS — Z23 Encounter for immunization: Secondary | ICD-10-CM | POA: Diagnosis not present

## 2020-06-02 DIAGNOSIS — O351XX0 Maternal care for (suspected) chromosomal abnormality in fetus, not applicable or unspecified: Secondary | ICD-10-CM

## 2020-06-02 DIAGNOSIS — O0990 Supervision of high risk pregnancy, unspecified, unspecified trimester: Secondary | ICD-10-CM

## 2020-06-02 LAB — POCT URINALYSIS DIPSTICK OB
Glucose, UA: NEGATIVE
POC,PROTEIN,UA: NEGATIVE

## 2020-06-02 NOTE — Progress Notes (Signed)
Routine Prenatal Care Visit  Subjective  Cheryl Macias is a 45 y.o. G3P1011 at [redacted]w[redacted]d being seen today for ongoing prenatal care.  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  ----------------------------------------------------------------------------------- Patient reports heartburn.   Contractions: Not present. Vag. Bleeding: None.  Movement: Present. Denies leaking of fluid.  ----------------------------------------------------------------------------------- The following portions of the patient's history were reviewed and updated as appropriate: allergies, current medications, past family history, past medical history, past social history, past surgical history and problem list. Problem list updated.   Objective  Blood pressure 100/70, height 5\' 4"  (1.626 m), weight 166 lb 6.4 oz (75.5 kg), last menstrual period 10/30/2019. Pregravid weight 142 lb (64.4 kg) Total Weight Gain 24 lb 6.4 oz (11.1 kg) Urinalysis:      Fetal Status: Fetal Heart Rate (bpm): 130 Fundal Height: 40 cm Movement: Present     General:  Alert, oriented and cooperative. Patient is in no acute distress.  Skin: Skin is warm and dry. No rash noted.   Cardiovascular: Normal heart rate noted  Respiratory: Normal respiratory effort, no problems with respiration noted  Abdomen: Soft, gravid, appropriate for gestational age. Pain/Pressure: Absent     Pelvic:  Cervical exam deferred        Extremities: Normal range of motion.  Edema: None  Mental Status: Normal mood and affect. Normal behavior. Normal judgment and thought content.     Assessment   45 y.o. 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 03/03/2020 by Jaynie Collins, MD No   Abnormal genetic test during pregnancy 02/06/2020 by Rod Can, CNM No   Supervision of high risk pregnancy, antepartum 12/27/2019 by Rod Can, CNM No   Overview Addendum 03/06/2020  2:56 PM by Homero Fellers, MD     Nursing Staff Provider  Office Location  [facility] Dating   LMP = 10 wk [place]  Language  English Anatomy US   complete  Flu Vaccine  Declined Genetic Screen  NIPS:   T18  TDaP vaccine    Hgb A1C or  GTT Third trimester :   Rhogam   not needed   LAB RESULTS   Feeding Plan  Breast Blood Type O/Positive/-- (11/24 1550)   Contraception  Antibody Negative (11/24 1550)  Circumcision  Rubella 1.96 (11/24 1550)  Pediatrician   RPR Non Reactive (11/24 1550)   Support Person   Fiance Paul   HBsAg Negative (11/24 1550)   Prenatal Classes  HIV Non Reactive (11/24 1550)    Varicella immune  BTL Consent  GBS  (For PCN allergy, check sensitivities)        VBAC Consent   Pap  NIL 2021    Hgb Electro   normal  COVID unvaccinated CF      SMA          High risk pregnancy Diagnoses Advanced Maternal Age Trisomy 18 on NIPS Anemia in pregnancy Gonorrhea in pregnancy- TOC needed February 2022 [ ]  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 Patient agrees with transfer of care to [facility]. Notified [facility] for transfer.  Tdap given  Gestational age appropriate obstetric precautions including but not limited to vaginal bleeding, contractions, leaking of fluid and fetal movement were reviewed in detail with the patient.    Return in about 2 weeks (around 06/16/2020) for ROb with MD.  Homero Fellers MD [place], [facility] 06/02/2020, 12:06 PM

## 2020-06-11 ENCOUNTER — Other Ambulatory Visit: Payer: Self-pay

## 2020-06-11 ENCOUNTER — Other Ambulatory Visit: Payer: Self-pay | Admitting: Advanced Practice Midwife

## 2020-06-11 ENCOUNTER — Ambulatory Visit: Payer: Managed Care, Other (non HMO) | Attending: Maternal & Fetal Medicine

## 2020-06-11 DIAGNOSIS — O365930 Maternal care for other known or suspected poor fetal growth, third trimester, not applicable or unspecified: Secondary | ICD-10-CM | POA: Diagnosis not present

## 2020-06-11 DIAGNOSIS — O365931 Maternal care for other known or suspected poor fetal growth, third trimester, fetus 1: Secondary | ICD-10-CM

## 2020-06-11 DIAGNOSIS — O351XX0 Maternal care for (suspected) chromosomal abnormality in fetus, not applicable or unspecified: Secondary | ICD-10-CM | POA: Diagnosis not present

## 2020-06-11 DIAGNOSIS — O359XX0 Maternal care for (suspected) fetal abnormality and damage, unspecified, not applicable or unspecified: Secondary | ICD-10-CM | POA: Diagnosis not present

## 2020-06-11 DIAGNOSIS — O3512X0 Maternal care for (suspected) chromosomal abnormality in fetus, trisomy 18, not applicable or unspecified: Secondary | ICD-10-CM

## 2020-06-11 DIAGNOSIS — O403XX0 Polyhydramnios, third trimester, not applicable or unspecified: Secondary | ICD-10-CM | POA: Diagnosis not present

## 2020-06-11 DIAGNOSIS — O09523 Supervision of elderly multigravida, third trimester: Secondary | ICD-10-CM

## 2020-06-11 DIAGNOSIS — Z3A32 32 weeks gestation of pregnancy: Secondary | ICD-10-CM | POA: Insufficient documentation

## 2020-06-23 ENCOUNTER — Other Ambulatory Visit: Payer: Managed Care, Other (non HMO)

## 2020-07-24 DIAGNOSIS — Q913 Trisomy 18, unspecified: Secondary | ICD-10-CM

## 2020-08-04 ENCOUNTER — Other Ambulatory Visit: Payer: Self-pay | Admitting: Obstetrics and Gynecology

## 2020-08-04 DIAGNOSIS — Z1231 Encounter for screening mammogram for malignant neoplasm of breast: Secondary | ICD-10-CM

## 2020-08-06 ENCOUNTER — Telehealth: Payer: Self-pay

## 2020-08-06 NOTE — Telephone Encounter (Signed)
Richardson Landry from [place] called to confirm pt's delivery date, type, and induction.  Information given.

## 2020-08-06 NOTE — Telephone Encounter (Signed)
FMLA/DISABILITY forms for [employer] (2) filled out.  Will obtain signature, fax and process.

## 2020-08-13 NOTE — Telephone Encounter (Signed)
Patient made payment for FMLA on 03/24/20

## 2020-08-31 ENCOUNTER — Encounter: Payer: Self-pay | Admitting: Obstetrics and Gynecology

## 2020-09-03 ENCOUNTER — Telehealth: Payer: Self-pay

## 2020-09-03 NOTE — Telephone Encounter (Signed)
Pt calling; has 6wk pp appt tomorrow; cycle has started; keep appt?  Is she due for pap?  [phone]  Adv pt she is not due for a pap tomorrow; to keep appt.

## 2020-09-04 ENCOUNTER — Other Ambulatory Visit: Payer: Self-pay

## 2020-09-04 ENCOUNTER — Ambulatory Visit (INDEPENDENT_AMBULATORY_CARE_PROVIDER_SITE_OTHER): Payer: Managed Care, Other (non HMO) | Admitting: Obstetrics and Gynecology

## 2020-09-04 ENCOUNTER — Encounter: Payer: Self-pay | Admitting: Obstetrics and Gynecology

## 2020-09-04 DIAGNOSIS — Z30430 Encounter for insertion of intrauterine contraceptive device: Secondary | ICD-10-CM | POA: Diagnosis not present

## 2020-09-04 NOTE — Progress Notes (Signed)
Postpartum Visit  Chief Complaint:  Chief Complaint  Patient presents with   Post-op Follow-up    6 wk postpartum - discuss options for birth control. RM 4    History of Present Illness: Patient is a 45 y.o. G3P1011 presents for postpartum visit.  Date of delivery: 07/25/2018 Type of delivery: Vaginal delivery - Vacuum or forceps assisted  no Episiotomy No.  Laceration: no  Pregnancy or labor problems:  yes Trisomy 18 Any problems since the delivery:  no  Newborn Details:  SINGLETON :  1. BabyGender female. Birth weight: 4lbs 5.5oz Maternal Details:  Breast or formula feeding: breast and bottle (pumping) Intercourse: No  Contraception after delivery: No  Any bowel or bladder issues: No  Post partum depression/anxiety noted:  no Edinburgh Post-Partum Depression Score: 6 Date of last PAP: 9/1/2021NIL and HR HPV negative   Review of Systems: Review of Systems  Constitutional: Negative.   Gastrointestinal: Negative.   Genitourinary: Negative.   Psychiatric/Behavioral: Negative.     The following portions of the patient's history were reviewed and updated as appropriate: allergies, current medications, past family history, past medical history, past social history, past surgical history, and problem list.  Past Medical History:  Past Medical History:  Diagnosis Date   Abnormal Pap smear    Prediabetes    Vitamin D deficiency     Past Surgical History:  Past Surgical History:  Procedure Laterality Date   BUNIONECTOMY     BOTH FEET   CRYOTHERAPY      Family History:  Family History  Problem Relation Age of Onset   Hypertension Father    Diabetes Paternal Grandmother    Breast cancer Neg Hx     Social History:  Social History   Socioeconomic History   Marital status: Single    Spouse name: Not on file   Number of children: Not on file   Years of education: Not on file   Highest education level: Not on file  Occupational History   Not on file   Tobacco Use   Smoking status: Never   Smokeless tobacco: Never  Vaping Use   Vaping Use: Never used  Substance and Sexual Activity   Alcohol use: Yes   Drug use: No   Sexual activity: Yes    Partners: Male    Birth control/protection: None  Other Topics Concern   Not on file  Social History Narrative   Not on file   Social Determinants of Health   Financial Resource Strain: Not on file  Food Insecurity: Not on file  Transportation Needs: Not on file  Physical Activity: Not on file  Stress: Not on file  Social Connections: Not on file  Intimate Partner Violence: Not on file    Allergies:  No Known Allergies  Medications: Prior to Admission medications   Medication Sig Start Date End Date Taking? Authorizing Provider  cholecalciferol (VITAMIN D) 1000 units tablet Take 1,000 Units by mouth daily.    [provider]  ferrous sulfate 325 (65 FE) MG EC tablet Take 325 mg by mouth 3 (three) times daily with meals.    [provider]  Prenatal Vit-Fe Fumarate-FA (PRENATAL MULTIVITAMIN) TABS tablet Take 1 tablet by mouth daily at 12 noon.    [provider]  valACYclovir (VALTREX) 1000 MG tablet Take 1 tablet (1,000 mg total) by mouth daily. [id number]   Copland, Deirdre Evener, PA-C    Physical Exam unknown if currently breastfeeding.    General:  NAD HEENT: normocephalic, anicteric Pulmonary: No increased work of breathing Abdomen: NABS, soft, non-tender, non-distended.  Umbilicus without lesions.  No hepatomegaly, splenomegaly or masses palpable. No evidence of hernia. Genitourinary:  External: Normal external female genitalia.  Normal urethral meatus, normal  Bartholin's and Skene's glands.    Vagina: Normal vaginal mucosa, no evidence of prolapse.    Cervix: Grossly normal in appearance, no bleeding  Uterus: Non-enlarged, mobile, normal contour.  No CMT  Adnexa: ovaries non-enlarged, no adnexal masses  Rectal: deferred Extremities: no edema,  erythema, or tenderness Neurologic: Grossly intact Psychiatric: mood appropriate, affect full   GYNECOLOGY OFFICE PROCEDURE NOTE  Cheryl Macias is a 45 y.o. G3P1011 here for ParaGard IUD insertion. No GYN concerns.  Last pap smear was on 10/16/2019 and was normal.  The patient is currently using absitance for contraception and her LMP is Patient's last menstrual period was 09/03/2020..  The indication for her IUD is cycle control.  IUD Insertion Procedure Note Patient identified, informed consent performed, consent signed.   Discussed risks of irregular bleeding, cramping, infection, malpositioning, expulsion or uterine perforation of the IUD (1:1000 placements)  which may require further procedure such as laparoscopy.  IUD while effective at preventing pregnancy do not prevent transmission of sexually transmitted diseases and use of barrier methods for this purpose was discussed. Time out was performed.  Urine pregnancy test negative.  Speculum placed in the vagina.  Cervix visualized.  Cleaned with Betadine x 2.  Grasped anteriorly with a single tooth tenaculum.  Uterus sounded to 7.5 cm. IUD placed per manufacturer's recommendations.  Strings trimmed to 3 cm. Tenaculum was removed, good hemostasis noted.  Patient tolerated procedure well.   Patient was given post-procedure instructions.  She was advised to have backup contraception for one week.  Patient was also asked to check IUD strings periodically and follow up in 6 weeks for IUD check.    Edinburgh Postnatal Depression Scale - 09/04/20 1006       Edinburgh Postnatal Depression Scale:  In the Past 7 Days   I have been able to laugh and see the funny side of things. 0    I have looked forward with enjoyment to things. 1    I have blamed myself unnecessarily when things went wrong. 1    I have been anxious or worried for no good reason. 0    I have felt scared or panicky for no good reason. 0    Things have been getting on top of me. 1     I have been so unhappy that I have had difficulty sleeping. 1    I have felt sad or miserable. 1    I have been so unhappy that I have been crying. 1    The thought of harming myself has occurred to me. 0    Edinburgh Postnatal Depression Scale Total 6             Edinburgh Postnatal Depression Scale - 09/04/20 1006       Edinburgh Postnatal Depression Scale:  In the Past 7 Days   I have been able to laugh and see the funny side of things. 0    I have looked forward with enjoyment to things. 1    I have blamed myself unnecessarily when things went wrong. 1    I have been anxious or worried for no good reason. 0    I have felt scared or panicky for no good reason. 0  Things have been getting on top of me. 1    I have been so unhappy that I have had difficulty sleeping. 1    I have felt sad or miserable. 1    I have been so unhappy that I have been crying. 1    The thought of harming myself has occurred to me. 0    Edinburgh Postnatal Depression Scale Total 6              Assessment: 46 y.o. G3P1011 presenting for 6 week postpartum visit  Plan: Problem List Items Addressed This Visit   None   1) Contraception - Education given regarding options for contraception, as well as compatibility with breast feeding if applicable.  Patient plans on IUD for contraception. - Patient plans IUD, and will schedule soon.  Risks and benefits of IUD discussed including the risks of irregular bleeding, cramping, infection, malpositioning, expulsion or uterine perforation of the IUD (1:1000 placements)  which may require further procedures such as laparoscopy.  IUDs while effective at preventing pregnancy do not prevent transmission of sexually transmitted diseases and use of barrier methods for this purpose was discussed.  Low overall incidence of failure with 99.7% efficacy rate in typical use.  The patient has not contraindication to IUD placement.  2)  Pap - ASCCP guidelines and  rational discussed.  ASCCP guidelines and rational discussed.  Patient opts for every 3 years screening interval  3) Patient underwent screening for postpartum depression with no signs of depression  4) Return in about 6 weeks (around 10/16/2020) for IUD string check.   Malachy Mood, MD, [facility], [facility] 09/04/2020, 10:49 AM

## 2020-09-28 ENCOUNTER — Telehealth: Payer: Managed Care, Other (non HMO)

## 2020-09-28 NOTE — Telephone Encounter (Signed)
Pt calling; had copper IUD inserted; had cycle on 7/21 and again on 8/12; is this normal?  [phone]  Pt aware irreg bleeding is normal as long as she is not saturating a pad every 30 minutes to an hour; to give her body a good three months to adjust to IUD; bleeding should straighten itself out but if it doesn't to let us know.

## 2020-10-15 ENCOUNTER — Other Ambulatory Visit: Payer: Self-pay | Admitting: Obstetrics and Gynecology

## 2020-10-16 ENCOUNTER — Ambulatory Visit (INDEPENDENT_AMBULATORY_CARE_PROVIDER_SITE_OTHER): Payer: Managed Care, Other (non HMO) | Admitting: Obstetrics and Gynecology

## 2020-10-16 ENCOUNTER — Other Ambulatory Visit: Payer: Self-pay | Admitting: Obstetrics and Gynecology

## 2020-10-16 ENCOUNTER — Other Ambulatory Visit: Payer: Self-pay

## 2020-10-16 ENCOUNTER — Other Ambulatory Visit (HOSPITAL_COMMUNITY)
Admission: RE | Admit: 2020-10-16 | Discharge: 2020-10-16 | Disposition: A | Discharge status: Home / Self Care | Payer: Managed Care, Other (non HMO) | Source: Ambulatory Visit | Attending: Obstetrics and Gynecology | Admitting: Obstetrics and Gynecology

## 2020-10-16 ENCOUNTER — Encounter: Payer: Self-pay | Admitting: Obstetrics and Gynecology

## 2020-10-16 ENCOUNTER — Telehealth: Payer: Self-pay

## 2020-10-16 VITALS — BP 122/84 | Ht 64.0 in | Wt 161.0 lb

## 2020-10-16 DIAGNOSIS — Z30431 Encounter for routine checking of intrauterine contraceptive device: Secondary | ICD-10-CM

## 2020-10-16 DIAGNOSIS — Z124 Encounter for screening for malignant neoplasm of cervix: Secondary | ICD-10-CM | POA: Diagnosis present

## 2020-10-16 DIAGNOSIS — Z1231 Encounter for screening mammogram for malignant neoplasm of breast: Secondary | ICD-10-CM

## 2020-10-16 NOTE — Telephone Encounter (Signed)
Patient stopped breast feeding two weeks ago. She doesn't want to wait 6 mos to get a mammogram. It's already been a while since she has had one. [id number]

## 2020-10-16 NOTE — Progress Notes (Signed)
Obstetrics & Gynecology Office Visit   Chief Complaint:  Chief Complaint  Patient presents with   Follow-up    IUD string check - RM 4    History of Present Illness: 45 y.o. patient presenting for follow up of Paragard IUD placement 6+ weeks ago (09/04/2020).  The indication for her IUD was contraception.  She denies any complications since her IUD placement.  Still having some occasional spotting.  is able to feel strings.    Review of Systems: Review of Systems  Constitutional: Negative.   Gastrointestinal: Negative.   Genitourinary: Negative.    Past Medical History:  Past Medical History:  Diagnosis Date   Abnormal Pap smear    Prediabetes    Vitamin D deficiency     Past Surgical History:  Past Surgical History:  Procedure Laterality Date   BUNIONECTOMY     BOTH FEET   CRYOTHERAPY      Gynecologic History: Patient's last menstrual period was 09/27/2020.  Obstetric History: G3P1011  Family History:  Family History  Problem Relation Age of Onset   Hypertension Father    Diabetes Paternal Grandmother    Breast cancer Neg Hx     Social History:  Social History   Socioeconomic History   Marital status: Single    Spouse name: Not on file   Number of children: Not on file   Years of education: Not on file   Highest education level: Not on file  Occupational History   Not on file  Tobacco Use   Smoking status: Never   Smokeless tobacco: Never  Vaping Use   Vaping Use: Never used  Substance and Sexual Activity   Alcohol use: Yes   Drug use: No   Sexual activity: Yes    Partners: Male    Birth control/protection: None  Other Topics Concern   Not on file  Social History Narrative   Not on file   Social Determinants of Health   Financial Resource Strain: Not on file  Food Insecurity: Not on file  Transportation Needs: Not on file  Physical Activity: Not on file  Stress: Not on file  Social Connections: Not on file  Intimate Partner  Violence: Not on file    Allergies:  No Known Allergies  Medications: Prior to Admission medications   Medication Sig Start Date End Date Taking? Authorizing Provider  cholecalciferol (VITAMIN D) 1000 units tablet Take 1,000 Units by mouth daily.   Yes [provider]  Prenatal Vit-Fe Fumarate-FA (PRENATAL MULTIVITAMIN) TABS tablet Take 1 tablet by mouth daily at 12 noon.   Yes [provider]  ferrous sulfate 325 (65 FE) MG EC tablet Take 325 mg by mouth 3 (three) times daily with meals.    [provider]  valACYclovir (VALTREX) 1000 MG tablet Take 1 tablet (1,000 mg total) by mouth daily. [id number]   Copland, Deirdre Evener, PA-C    Physical Exam Blood pressure 122/84, height '5\' 4"'$  (1.626 m), weight 161 lb (73 kg), last menstrual period 09/27/2020, unknown if currently breastfeeding. Patient's last menstrual period was 09/27/2020.  General: NAD HEENT: normocephalic, anicteric Pulmonary: No increased work of breathing  Genitourinary:  External: Normal external female genitalia.  Normal urethral meatus, normal  Bartholin's and Skene's glands.    Vagina: Normal vaginal mucosa, no evidence of prolapse.    Cervix: Grossly normal in appearance, no bleeding, IUD strings visualized 2cm  Uterus: Non-enlarged, mobile, normal contour.  No CMT  Adnexa: ovaries non-enlarged, no adnexal  masses  Rectal: deferred  Lymphatic: no evidence of inguinal lymphadenopathy Extremities: no edema, erythema, or tenderness Neurologic: Grossly intact Psychiatric: mood appropriate, affect full  Female chaperone present for pelvic and breast  portions of the physical exam  Assessment: 45 y.o. G3P1011 \\IUD check up   Plan: Problem List Items Addressed This Visit   None Visit Diagnoses     IUD check up    -  Primary   Relevant Orders   Cytology - PAP   Screening for malignant neoplasm of cervix       Relevant Orders   Cytology - PAP        1.  The patient was given  instructions to check her IUD strings monthly and call with any problems or concerns.  She should call for fevers, chills, abnormal vaginal discharge, pelvic pain, or other complaints.  2.   IUDs while effective at preventing pregnancy do not prevent transmission of sexually transmitted diseases and use of barrier methods for this purpose was discussed.  Low overall incidence of failure with 99.7% efficacy rate in typical use.  The patient has not contraindication to IUD placement.  3.  She will return for a annual exam in 1 year.  All questions answered.  4) A total of 15 minutes were spent in face-to-face contact with the patient during this encounter with over half of that time devoted to counseling and coordination of care.  5) Return in about 1 year (around 10/16/2021) for annual.   Malachy Mood, MD, [place] 10/16/2020, 8:32 AM

## 2020-10-16 NOTE — Telephone Encounter (Signed)
They will not do it until 6 months the order is in they can not see anything because of the changes in the milk ducts until they have gone back to normal.  The order is in for 6 months she can call [facility] Phone: [phone] to schedule

## 2020-10-20 NOTE — Telephone Encounter (Signed)
LMVM to notify.

## 2020-10-23 ENCOUNTER — Telehealth: Payer: Self-pay

## 2020-10-23 NOTE — Telephone Encounter (Signed)
Advised results are delayed due to Labor Day. She should see results today or tomorrow per lab.

## 2020-10-23 NOTE — Telephone Encounter (Signed)
Inquiring about pap results from 10/16/20. [id number]

## 2020-10-27 LAB — CYTOLOGY - PAP
Comment: NEGATIVE
Comment: NEGATIVE
Diagnosis: UNDETERMINED — AB
HPV 16: NEGATIVE
HPV 18 / 45: NEGATIVE
High risk HPV: POSITIVE — AB

## 2020-10-29 ENCOUNTER — Telehealth: Payer: Self-pay

## 2020-10-29 ENCOUNTER — Telehealth: Payer: Self-pay | Admitting: Obstetrics and Gynecology

## 2020-10-29 NOTE — Telephone Encounter (Signed)
Pt is scheduled for 9/23 at 1:30 with Dr. Georgianne Fick.

## 2020-10-29 NOTE — Telephone Encounter (Signed)
Called and left voicemail for patient to call back to be scheduled. 

## 2020-10-29 NOTE — Progress Notes (Signed)
Needs colposcopy in the next 1-4 weeks patient aware

## 2020-10-29 NOTE — Telephone Encounter (Signed)
Patient is scheduled for 11/06/20

## 2020-10-29 NOTE — Telephone Encounter (Signed)
-----   Message from Malachy Mood, MD sent at 10/29/2020 12:18 PM EDT ----- Needs colposcopy in the next 1-4 weeks patient aware

## 2020-10-30 LAB — OB RESULTS CONSOLE TSH: TSH: 1.51

## 2020-10-30 LAB — OB RESULTS CONSOLE GC/CHLAMYDIA
Chlamydia: NEGATIVE
Neisseria Gonorrhea: NEGATIVE

## 2020-10-30 LAB — HEPATITIS C ANTIBODY: HCV Ab: NEGATIVE

## 2020-10-30 LAB — OB RESULTS CONSOLE HIV ANTIBODY (ROUTINE TESTING): HIV: NONREACTIVE

## 2020-11-06 ENCOUNTER — Other Ambulatory Visit: Payer: Self-pay

## 2020-11-06 ENCOUNTER — Other Ambulatory Visit (HOSPITAL_COMMUNITY)
Admission: RE | Admit: 2020-11-06 | Discharge: 2020-11-06 | Disposition: A | Discharge status: Home / Self Care | Payer: Managed Care, Other (non HMO) | Source: Ambulatory Visit | Attending: Obstetrics and Gynecology | Admitting: Obstetrics and Gynecology

## 2020-11-06 ENCOUNTER — Encounter: Payer: Self-pay | Admitting: Obstetrics and Gynecology

## 2020-11-06 ENCOUNTER — Ambulatory Visit (INDEPENDENT_AMBULATORY_CARE_PROVIDER_SITE_OTHER): Payer: Managed Care, Other (non HMO) | Admitting: Obstetrics and Gynecology

## 2020-11-06 VITALS — BP 128/82 | Wt 161.0 lb

## 2020-11-06 DIAGNOSIS — R87810 Cervical high risk human papillomavirus (HPV) DNA test positive: Secondary | ICD-10-CM

## 2020-11-06 DIAGNOSIS — R87610 Atypical squamous cells of undetermined significance on cytologic smear of cervix (ASC-US): Secondary | ICD-10-CM

## 2020-11-06 NOTE — Progress Notes (Signed)
   GYNECOLOGY CLINIC COLPOSCOPY PROCEDURE NOTE  45 y.o. I4P8099 here for colposcopy for ASCUS with POSITIVE high risk HPV  pap smear on 10/16/2020. Discussed underlying role for HPV infection in the development of cervical dysplasia, its natural history and progression/regression, need for surveillance.  Prior Pap  10/16/2019 NILM HPV negative 10/15/2018 NILM HPV negative 05/24/2017 NILM HPV negative 05/03/2016 NILM HPV negative  Is the patient  pregnant: No LMP: No LMP recorded. Smoking status:  reports that she has never smoked. She has never used smokeless tobacco. Contraception: IUD High risk partner:No History of STD:  No Future fertility desired:  Yes  Patient given informed consent, signed copy in the chart, time out was performed.  The patient was position in dorsal lithotomy position. Speculum was placed the cervix was visualized.   After application of acetic acid colposcopic inspection of the cervix was undertaken.   Colposcopy adequate, full visualization of transformation zone: Yes no visible lesions; corresponding biopsies obtained (random 12 O'Clock) ECC specimen obtained:  Yes  All specimens were labeled and sent to pathology.   Patient was given post procedure instructions.  Will follow up pathology and manage accordingly.  Routine preventative health maintenance measures emphasized.  OBGyn Exam  Malachy Mood, MD, [facility], [facility]

## 2020-11-10 LAB — SURGICAL PATHOLOGY

## 2021-03-25 ENCOUNTER — Telehealth: Payer: Self-pay

## 2021-03-25 NOTE — Telephone Encounter (Signed)
Pt calling; it has been awhile since she has had a mammogram; she delivered in June and still has some milk in her; can she still get a mammogram?  [phone]  Adv pt call [facility] and ask them; adv order is in.  To call if problems.

## 2021-04-07 ENCOUNTER — Telehealth: Payer: Self-pay

## 2021-04-07 ENCOUNTER — Other Ambulatory Visit: Payer: Self-pay | Admitting: Obstetrics & Gynecology

## 2021-04-07 DIAGNOSIS — Z1231 Encounter for screening mammogram for malignant neoplasm of breast: Secondary | ICD-10-CM

## 2021-04-07 NOTE — Telephone Encounter (Signed)
Pt calling; needs new order for mammogram from [facility] or [facility].  [phone]

## 2021-04-08 NOTE — Telephone Encounter (Signed)
Pt aware.

## 2021-06-01 ENCOUNTER — Ambulatory Visit
Admission: RE | Admit: 2021-06-01 | Discharge: 2021-06-01 | Disposition: A | Discharge status: Home / Self Care | Payer: Managed Care, Other (non HMO) | Source: Ambulatory Visit | Attending: Obstetrics & Gynecology | Admitting: Obstetrics & Gynecology

## 2021-06-01 ENCOUNTER — Encounter: Payer: Self-pay | Admitting: Radiology

## 2021-06-01 DIAGNOSIS — Z1231 Encounter for screening mammogram for malignant neoplasm of breast: Secondary | ICD-10-CM | POA: Insufficient documentation

## 2021-08-24 ENCOUNTER — Ambulatory Visit (INDEPENDENT_AMBULATORY_CARE_PROVIDER_SITE_OTHER): Payer: Self-pay

## 2021-08-24 VITALS — Wt 175.0 lb

## 2021-08-24 DIAGNOSIS — Z3A00 Weeks of gestation of pregnancy not specified: Secondary | ICD-10-CM

## 2021-08-24 DIAGNOSIS — Z3480 Encounter for supervision of other normal pregnancy, unspecified trimester: Secondary | ICD-10-CM | POA: Insufficient documentation

## 2021-08-24 NOTE — Progress Notes (Signed)
New OB Intake  I connected with  Cheryl Macias on 08/24/21 at  3:15 PM EDT by telephone  and verified that I am speaking with the correct person using two identifiers. Nurse is located at [employer] and pt is located at work.  I explained I am completing New OB Intake today. We discussed her EDD of 03/25/2022 that is based on LMP of 06/18/2021. Pt is G4/P2011. I reviewed her allergies, medications, Medical/Surgical/OB history, and appropriate screenings. Based on history, this is a/an pregnancy uncomplicated .   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  Anatomy US Explained first scheduled [place] will be around 20 weeks.   Labs Discussed genetic screening with patient. Patient undecided genetic testing. Discussed possible labs to be drawn at new OB lab appointment.  COVID Vaccine Patient has not had COVID vaccine.   Social Determinants of Health Food Insecurity: denies food insecurity Transportation: Patient denies transportation needs.  First visit review I reviewed new OB appt with pt. I explained she will have ob bloodwork and pap smear/pelvic exam if indicated. Explained pt will be seen by Rubie Maid, MD at first visit; encounter routed to appropriate provider.   Cleophas Dunker, [facility] 08/24/2021  3:46 PM  Clinical Staff Provider  Office Location  [facility] Dating    Language  English Anatomy US    Flu Vaccine  offer Genetic Screen  NIPS:   TDaP vaccine   offer Hgb A1C or  GTT Early : Third  trimester :   Covid declines   LAB RESULTS   Rhogam   Blood Type     Feeding Plan breast Antibody    Contraception IUD Rubella    Circumcision yes RPR     Pediatrician  undecided HBsAg     Support Person Paul HIV    Prenatal Classes yes Varicella     GBS  (For PCN allergy, check sensitivities)   BTL Consent  Hep C   VBAC Consent  Pap      Hgb Electro      CF      SMA

## 2021-08-30 ENCOUNTER — Other Ambulatory Visit: Payer: Self-pay

## 2021-08-30 DIAGNOSIS — Z3480 Encounter for supervision of other normal pregnancy, unspecified trimester: Secondary | ICD-10-CM

## 2021-08-31 ENCOUNTER — Ambulatory Visit (INDEPENDENT_AMBULATORY_CARE_PROVIDER_SITE_OTHER): Payer: Self-pay | Admitting: Obstetrics and Gynecology

## 2021-08-31 VITALS — BP 123/78 | Ht 64.0 in | Wt 175.0 lb

## 2021-08-31 DIAGNOSIS — R87810 Cervical high risk human papillomavirus (HPV) DNA test positive: Secondary | ICD-10-CM

## 2021-08-31 DIAGNOSIS — Z8489 Family history of other specified conditions: Secondary | ICD-10-CM

## 2021-08-31 DIAGNOSIS — O09521 Supervision of elderly multigravida, first trimester: Secondary | ICD-10-CM

## 2021-08-31 DIAGNOSIS — O0991 Supervision of high risk pregnancy, unspecified, first trimester: Secondary | ICD-10-CM

## 2021-08-31 DIAGNOSIS — R87610 Atypical squamous cells of undetermined significance on cytologic smear of cervix (ASC-US): Secondary | ICD-10-CM | POA: Insufficient documentation

## 2021-08-31 LAB — CBC/D/PLT+RPR+RH+ABO+RUBIGG...
Antibody Screen: NEGATIVE
Basophils Absolute: 0 10*3/uL (ref 0.0–0.2)
Basos: 0 %
EOS (ABSOLUTE): 0.6 10*3/uL — ABNORMAL HIGH (ref 0.0–0.4)
Eos: 8 %
HCV Ab: NONREACTIVE
HIV Screen 4th Generation wRfx: NONREACTIVE
Hematocrit: 37.4 % (ref 34.0–46.6)
Hemoglobin: 12.6 g/dL (ref 11.1–15.9)
Hepatitis B Surface Ag: NEGATIVE
Immature Grans (Abs): 0 10*3/uL (ref 0.0–0.1)
Immature Granulocytes: 0 %
Lymphocytes Absolute: 2 10*3/uL (ref 0.7–3.1)
Lymphs: 26 %
MCH: 29.1 pg (ref 26.6–33.0)
MCHC: 33.7 g/dL (ref 31.5–35.7)
MCV: 86 fL (ref 79–97)
Monocytes Absolute: 0.5 10*3/uL (ref 0.1–0.9)
Monocytes: 7 %
Neutrophils Absolute: 4.4 10*3/uL (ref 1.4–7.0)
Neutrophils: 59 %
Platelets: 268 10*3/uL (ref 150–450)
RBC: 4.33 x10E6/uL (ref 3.77–5.28)
RDW: 12.4 % (ref 11.7–15.4)
RPR Ser Ql: NONREACTIVE
Rh Factor: POSITIVE
Rubella Antibodies, IGG: 1.97 index (ref >0.99)
Varicella zoster IgG: 1336 index (ref >165)
WBC: 7.6 10*3/uL (ref 3.4–10.8)

## 2021-08-31 LAB — HCV INTERPRETATION

## 2021-08-31 NOTE — Progress Notes (Signed)
OBSTETRIC INITIAL PRENATAL VISIT  Subjective:    Cheryl Macias is being seen today for her first obstetrical visit.  This is a planned pregnancy (IVF, single FET of PGT-a tested euploid embryo on 07/06/2021), referred from [facility]. She is a 46 y.o. G4P2011 female at 64w4dgestation, Estimated Date of Delivery: 03/25/22 with Patient's last menstrual period was 06/18/2021 (exact date).,  consistent with  6 week sono. Her obstetrical history is significant for advanced maternal age and , prediabetes, and history of Trisomy 18 in prior pregnancy (neonatal demise at 89days of life) . Relationship with FOB: spouse, living together. Patient does intend to breast feed. Pregnancy history fully reviewed.  Has completed estrogen patches and injectable progesterone. Now currently on ASA and oral progesterone until 12 weeks.   OB History  Gravida Para Term Preterm AB Living  '4 2 2 '$ 0 1 1  SAB IAB Ectopic Multiple Live Births  0 1 0 0 2    # Outcome Date GA Lbr Len/2nd Weight Sex Delivery Anes PTL Lv  4 Current           3 Term 07/24/20 325w0d4 lb 8 oz (2.041 kg) M Vag-Spont  N DEC     Complications: Trisomy 18  2 Term 09/21/01 3854w0d lb 13 oz (3.09 kg) M Vag-Spont  N LIV  1 IAB             Gynecologic History:  Last pap smear was 10-16-20.  Results were  abnormal: ASCUS HPV+  .  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  Denies h/o abnormal pap smears in the past.  Denies history of STIs.  Contraception prior to conception: None   Past Medical History:  Diagnosis Date   Abnormal Pap smear    Prediabetes    Vitamin D deficiency     Family History  Problem Relation Age of Onset   Hypertension Father    Lupus Father    Diabetes Paternal Grandmother    Breast cancer Neg Hx     Past Surgical History:  Procedure Laterality Date   BUNIONECTOMY     BOTH FEET   CRYOTHERAPY     MYOMECTOMY     vaginal    Social History   Socioeconomic History   Marital status: Married     Spouse name: PauEddie DibblesNumber of children: 1   Years of education: 18   Highest education level: Not on file  Occupational History   Occupation: [employer]  Tobacco Use   Smoking status: Never   Smokeless tobacco: Never  Vaping Use   Vaping Use: Never used  Substance and Sexual Activity   Alcohol use: Not Currently   Drug use: No   Sexual activity: Yes    Partners: Male    Birth control/protection: None  Other Topics Concern   Not on file  Social History Narrative   Not on file   Social Determinants of Health   Financial Resource Strain: Low Risk  (08/24/2021)   Overall Financial Resource Strain (CARDIA)    Difficulty of Paying Living Expenses: Not hard at all  Food Insecurity: No Food Insecurity (08/24/2021)   Hunger Vital Sign    Worried About Running Out of Food in the Last Year: Never true    Ran Out of Food in the Last Year: Never true  Transportation Needs: No Transportation Needs (08/24/2021)   PRAPARE - Transportation    Lack  of Transportation (Medical): No    Lack of Transportation (Non-Medical): No  Physical Activity: Insufficiently Active (08/24/2021)   Exercise Vital Sign    Days of Exercise per Week: 3 days    Minutes of Exercise per Session: 30 min  Stress: No Stress Concern Present (08/24/2021)   [place]    Feeling of Stress : Not at all  Social Connections: Moderately Integrated (08/24/2021)   Social Connection and Isolation Panel [NHANES]    Frequency of Communication with Friends and Family: Twice a week    Frequency of Social Gatherings with Friends and Family: Once a week    Attends Religious Services: Never    Marine scientist or Organizations: Yes    Attends Archivist Meetings: Never    Marital Status: Married  Human resources officer Violence: Not At Risk (08/24/2021)   Humiliation, Afraid, Rape, and Kick questionnaire    Fear of Current or  Ex-Partner: No    Emotionally Abused: No    Physically Abused: No    Sexually Abused: No    Current Outpatient Medications on File Prior to Visit  Medication Sig Dispense Refill   cholecalciferol (VITAMIN D) 1000 units tablet Take 1,000 Units by mouth daily.     ferrous sulfate 325 (65 FE) MG EC tablet Take 325 mg by mouth 3 (three) times daily with meals.     Prenatal Vit-Fe Fumarate-FA (PRENATAL MULTIVITAMIN) TABS tablet Take 1 tablet by mouth daily at 12 noon.     progesterone 50 MG/ML injection Inject 1 mL into the muscle daily. Starting on the 15th     valACYclovir (VALTREX) 1000 MG tablet Take 1 tablet (1,000 mg total) by mouth daily. 90 tablet 0   No current facility-administered medications on file prior to visit.    No Known Allergies   Review of Systems General: Not Present- Fever, Weight Loss and Weight Gain. Skin: Not Present- Rash. HEENT: Not Present- Blurred Vision, Headache and Bleeding Gums. Respiratory: Not Present- Difficulty Breathing. Breast: Not Present- Breast Mass. Cardiovascular: Not Present- Chest Pain, Elevated Blood Pressure, Fainting / Blacking Out and Shortness of Breath. Gastrointestinal: Not Present- Abdominal Pain, Constipation, Nausea and Vomiting. Female Genitourinary: Not Present- Frequency, Painful Urination, Pelvic Pain, Vaginal Bleeding, Vaginal Discharge, Contractions, regular, Fetal Movements Decreased, Urinary Complaints and Vaginal Fluid. Musculoskeletal: Not Present- Back Pain and Leg Cramps. Neurological: Not Present- Dizziness. Psychiatric: Not Present- Depression.     Objective:   Blood pressure 123/78, weight 175 lb (79.4 kg), last menstrual period 06/18/2021.   Body mass index is 30.04 kg/m.  General Appearance:    Alert, cooperative, no distress, appears stated age  Head:    Normocephalic, without obvious abnormality, atraumatic  Eyes:    PERRL, conjunctiva/corneas clear, EOM's intact, both eyes  Ears:    Normal external ear  canals, both ears  Nose:   Nares normal, septum midline, mucosa normal, no drainage or sinus tenderness  Throat:   Lips, mucosa, and tongue normal; teeth and gums normal  Neck:   Supple, symmetrical, trachea midline, no adenopathy; thyroid: no enlargement/tenderness/nodules; no carotid bruit or JVD  Back:     Symmetric, no curvature, ROM normal, no CVA tenderness  Lungs:     Clear to auscultation bilaterally, respirations unlabored  Chest Wall:    No tenderness or deformity   Heart:    Regular rate and rhythm, S1 and S2 normal, no murmur, rub or gallop  Breast Exam:  Deferred.   Abdomen:     Soft, non-tender, bowel sounds active all four quadrants, no masses, no organomegaly.  FHT 177 bpm.  Genitalia:    Pelvic deferred. Will defer until September when pap is due.   Rectal:    Deferred.   Extremities:   Extremities normal, atraumatic, no cyanosis or edema  Pulses:   2+ and symmetric all extremities  Skin:   Skin color, texture, turgor normal, no rashes or lesions  Lymph nodes:   Cervical, supraclavicular, and axillary nodes normal  Neurologic:   CNII-XII intact, normal strength, sensation and reflexes throughout     Assessment:   1. Supervision of high risk pregnancy in first trimester   2. Elderly multigravida in first trimester   3. Newborn product of in vitro fertilization (IVF) pregnancy   4. Family history of genetic disorder   5. Atypical squamous cell changes of undetermined significance (ASCUS) on cervical cytology with positive high risk human papilloma virus (HPV)      Plan:   Supervision of high risk elderly multigravida in pregnancy  - Initial labs reviewed.  - Prenatal vitamins encouraged.  - Problem list reviewed and updated. - New OB counseling:  The patient has been given an overview regarding routine prenatal care.  Recommendations regarding diet, weight gain, and exercise in pregnancy were given. - Prenatal testing, optional genetic testing, and ultrasound use  in pregnancy were reviewed.  Traditional genetic screening vs cell-fee DNA genetic screening discussed, including risks and benefits. Testing requested. - Benefits of Breast Feeding were discussed. The patient is encouraged to consider nursing her baby post partum.  2. IVF pregnancy - Patient to continue hormonal supplementation until 12 weeks.  - Recommend continuation of ASA throughout the pregnancy  3. History of Trisomy 18 in prior pregnancy - Discussed genetic testing. Had pre-implantation genetic screening performed on embryo.  Discussed cell-free DNA testing as well as AFP later in the pregnancy.   4. ASCUS HR HPV positive pap  - Patient with h/o abnormal pap smear, s/p normal colposcopy.  For repeat pap in September.    Follow up in 4 weeks.    Rubie Maid, MD [facility]

## 2021-09-07 ENCOUNTER — Encounter: Payer: Self-pay | Admitting: Obstetrics and Gynecology

## 2021-09-14 ENCOUNTER — Encounter: Payer: Self-pay | Admitting: Obstetrics and Gynecology

## 2021-09-14 ENCOUNTER — Other Ambulatory Visit: Payer: Self-pay

## 2021-09-14 DIAGNOSIS — Z3A12 12 weeks gestation of pregnancy: Secondary | ICD-10-CM

## 2021-09-14 DIAGNOSIS — Z1379 Encounter for other screening for genetic and chromosomal anomalies: Secondary | ICD-10-CM

## 2021-09-14 DIAGNOSIS — O09521 Supervision of elderly multigravida, first trimester: Secondary | ICD-10-CM

## 2021-09-14 DIAGNOSIS — Z113 Encounter for screening for infections with a predominantly sexual mode of transmission: Secondary | ICD-10-CM

## 2021-09-14 DIAGNOSIS — Z0283 Encounter for blood-alcohol and blood-drug test: Secondary | ICD-10-CM

## 2021-09-15 LAB — URINALYSIS, ROUTINE W REFLEX MICROSCOPIC
Bilirubin, UA: NEGATIVE
Glucose, UA: NEGATIVE
Ketones, UA: NEGATIVE
Leukocytes,UA: NEGATIVE
Nitrite, UA: NEGATIVE
Protein,UA: NEGATIVE
RBC, UA: NEGATIVE
Specific Gravity, UA: 1.009 (ref 1.005–1.030)
Urobilinogen, Ur: 0.2 mg/dL (ref 0.2–1.0)
pH, UA: 6.5 (ref 5.0–7.5)

## 2021-09-15 LAB — PAIN MGT SCRN (14 DRUGS), UR
Amphetamine Scrn, Ur: NEGATIVE ng/mL
BARBITURATE SCREEN URINE: NEGATIVE ng/mL
BENZODIAZEPINE SCREEN, URINE: NEGATIVE ng/mL
Buprenorphine, Urine: NEGATIVE ng/mL
CANNABINOIDS UR QL SCN: NEGATIVE ng/mL
Cocaine (Metab) Scrn, Ur: NEGATIVE ng/mL
Creatinine(Crt), U: 53.3 mg/dL (ref 20.0–300.0)
Fentanyl, Urine: NEGATIVE pg/mL
Meperidine Screen, Urine: NEGATIVE ng/mL
Methadone Screen, Urine: NEGATIVE ng/mL
OXYCODONE+OXYMORPHONE UR QL SCN: NEGATIVE ng/mL
Opiate Scrn, Ur: NEGATIVE ng/mL
Ph of Urine: 5.8 (ref 4.5–8.9)
Phencyclidine Qn, Ur: NEGATIVE ng/mL
Propoxyphene Scrn, Ur: NEGATIVE ng/mL
Tramadol Screen, Urine: NEGATIVE ng/mL

## 2021-09-15 LAB — NICOTINE SCREEN, URINE: Cotinine Ql Scrn, Ur: NEGATIVE ng/mL

## 2021-09-16 LAB — GC/CHLAMYDIA PROBE AMP
Chlamydia trachomatis, NAA: NEGATIVE
Neisseria Gonorrhoeae by PCR: NEGATIVE

## 2021-09-16 LAB — CULTURE, OB URINE

## 2021-09-16 LAB — URINE CULTURE, OB REFLEX

## 2021-09-28 ENCOUNTER — Encounter: Payer: Self-pay | Admitting: Obstetrics and Gynecology

## 2021-09-28 ENCOUNTER — Ambulatory Visit (INDEPENDENT_AMBULATORY_CARE_PROVIDER_SITE_OTHER): Payer: Managed Care, Other (non HMO) | Admitting: Obstetrics and Gynecology

## 2021-09-28 VITALS — BP 98/67 | HR 70 | Wt 175.5 lb

## 2021-09-28 DIAGNOSIS — Z3A14 14 weeks gestation of pregnancy: Secondary | ICD-10-CM

## 2021-09-28 DIAGNOSIS — Z87898 Personal history of other specified conditions: Secondary | ICD-10-CM

## 2021-09-28 DIAGNOSIS — O09522 Supervision of elderly multigravida, second trimester: Secondary | ICD-10-CM

## 2021-09-28 NOTE — Progress Notes (Signed)
ROB: No complaints.  Taking vitamins as directed.  aFP discussed- consider at next visit.

## 2021-09-28 NOTE — Progress Notes (Signed)
ROB. Patient states doing well. She states no questions or concerns at this time.

## 2021-10-21 ENCOUNTER — Other Ambulatory Visit: Payer: Self-pay

## 2021-10-26 ENCOUNTER — Other Ambulatory Visit: Payer: Self-pay

## 2021-10-26 ENCOUNTER — Ambulatory Visit: Payer: Managed Care, Other (non HMO) | Attending: Obstetrics and Gynecology

## 2021-10-26 ENCOUNTER — Encounter: Payer: Self-pay | Admitting: Obstetrics and Gynecology

## 2021-10-26 ENCOUNTER — Other Ambulatory Visit (HOSPITAL_COMMUNITY)
Admission: RE | Admit: 2021-10-26 | Discharge: 2021-10-26 | Disposition: A | Discharge status: Home / Self Care | Payer: Managed Care, Other (non HMO) | Source: Ambulatory Visit | Attending: Obstetrics and Gynecology | Admitting: Obstetrics and Gynecology

## 2021-10-26 ENCOUNTER — Ambulatory Visit (INDEPENDENT_AMBULATORY_CARE_PROVIDER_SITE_OTHER): Payer: Managed Care, Other (non HMO) | Admitting: Obstetrics and Gynecology

## 2021-10-26 VITALS — BP 122/73 | HR 75 | Wt 182.5 lb

## 2021-10-26 DIAGNOSIS — O0991 Supervision of high risk pregnancy, unspecified, first trimester: Secondary | ICD-10-CM

## 2021-10-26 DIAGNOSIS — Z363 Encounter for antenatal screening for malformations: Secondary | ICD-10-CM | POA: Diagnosis not present

## 2021-10-26 DIAGNOSIS — R81 Glycosuria: Secondary | ICD-10-CM

## 2021-10-26 DIAGNOSIS — Z3A18 18 weeks gestation of pregnancy: Secondary | ICD-10-CM | POA: Insufficient documentation

## 2021-10-26 DIAGNOSIS — Z124 Encounter for screening for malignant neoplasm of cervix: Secondary | ICD-10-CM

## 2021-10-26 DIAGNOSIS — Z8489 Family history of other specified conditions: Secondary | ICD-10-CM

## 2021-10-26 DIAGNOSIS — O09522 Supervision of elderly multigravida, second trimester: Secondary | ICD-10-CM | POA: Diagnosis not present

## 2021-10-26 DIAGNOSIS — N898 Other specified noninflammatory disorders of vagina: Secondary | ICD-10-CM

## 2021-10-26 DIAGNOSIS — O09292 Supervision of pregnancy with other poor reproductive or obstetric history, second trimester: Secondary | ICD-10-CM | POA: Insufficient documentation

## 2021-10-26 DIAGNOSIS — E669 Obesity, unspecified: Secondary | ICD-10-CM

## 2021-10-26 DIAGNOSIS — O09812 Supervision of pregnancy resulting from assisted reproductive technology, second trimester: Secondary | ICD-10-CM | POA: Diagnosis not present

## 2021-10-26 DIAGNOSIS — O99212 Obesity complicating pregnancy, second trimester: Secondary | ICD-10-CM | POA: Insufficient documentation

## 2021-10-26 DIAGNOSIS — O0990 Supervision of high risk pregnancy, unspecified, unspecified trimester: Secondary | ICD-10-CM | POA: Insufficient documentation

## 2021-10-26 DIAGNOSIS — Z87898 Personal history of other specified conditions: Secondary | ICD-10-CM

## 2021-10-26 DIAGNOSIS — Z1379 Encounter for other screening for genetic and chromosomal anomalies: Secondary | ICD-10-CM

## 2021-10-26 DIAGNOSIS — O09521 Supervision of elderly multigravida, first trimester: Secondary | ICD-10-CM

## 2021-10-26 MED ORDER — PANTOPRAZOLE SODIUM 20 MG PO TBEC: 20.0000 mg | DELAYED_RELEASE_TABLET | Freq: Two times a day (BID) | ORAL | 2 refills | Status: DC

## 2021-10-26 NOTE — Progress Notes (Signed)
ROB: She is doing well, no new concerns. 

## 2021-10-26 NOTE — Progress Notes (Signed)
ROB: Notes heartburn, taking Tums often. Will switch to Protonix. Glucosuria noted on today's UA, patient reports drinking a soda just prior to visit. Continue to monitor as patient also with h/o pre-diabetes. For AFP today. C/o vaginal odor, vaginal swab performed today. Patient due for pap due to h/o abnormal pap and CIN I last year.  Will perform today as well. S/p MFM consultation and anatomy scan today due to h/o Trisomy 18, will repeat anatomy in several weeks.  Also will be scheduled for fetal echo. RTC in 4 weeks.

## 2021-10-28 LAB — CERVICOVAGINAL ANCILLARY ONLY
Bacterial Vaginitis (gardnerella): NEGATIVE
Candida Glabrata: NEGATIVE
Candida Vaginitis: NEGATIVE
Comment: NEGATIVE
Comment: NEGATIVE
Comment: NEGATIVE
Comment: NEGATIVE
Trichomonas: NEGATIVE

## 2021-11-03 LAB — CYTOLOGY - PAP: Diagnosis: NEGATIVE

## 2021-11-18 ENCOUNTER — Encounter: Payer: Self-pay | Admitting: Obstetrics and Gynecology

## 2021-11-18 ENCOUNTER — Ambulatory Visit (INDEPENDENT_AMBULATORY_CARE_PROVIDER_SITE_OTHER): Payer: Managed Care, Other (non HMO) | Admitting: Obstetrics and Gynecology

## 2021-11-18 VITALS — BP 104/75 | HR 76 | Wt 183.6 lb

## 2021-11-18 DIAGNOSIS — Z3A21 21 weeks gestation of pregnancy: Secondary | ICD-10-CM

## 2021-11-18 DIAGNOSIS — O09522 Supervision of elderly multigravida, second trimester: Secondary | ICD-10-CM

## 2021-11-18 LAB — POCT URINALYSIS DIPSTICK OB
Bilirubin, UA: NEGATIVE
Blood, UA: NEGATIVE
Glucose, UA: NEGATIVE
Ketones, UA: NEGATIVE
Leukocytes, UA: NEGATIVE
Nitrite, UA: NEGATIVE
POC,PROTEIN,UA: NEGATIVE
Spec Grav, UA: <=1.005 — AB (ref 1.010–1.025)
Urobilinogen, UA: 0.2 E.U./dL
pH, UA: 6.5 (ref 5.0–8.0)

## 2021-11-18 NOTE — Progress Notes (Signed)
ROB. Patient states daily fetal movement with occasional round ligament pains and braxton hicks. She states no questions or concerns at this time. Declines flu shot at this time.

## 2021-11-18 NOTE — Progress Notes (Signed)
ROB: She reports she is doing well.  Still has heartburn but did not realize she had a prescription pending for Protonix.  She will pick that up and try it.  She had her anatomy ultrasound and is currently scheduled for a fetal echo.  Advised to take ASA.

## 2021-11-23 ENCOUNTER — Other Ambulatory Visit: Payer: Self-pay

## 2021-11-23 DIAGNOSIS — O09522 Supervision of elderly multigravida, second trimester: Secondary | ICD-10-CM

## 2021-11-25 ENCOUNTER — Other Ambulatory Visit: Payer: Self-pay

## 2021-11-25 ENCOUNTER — Ambulatory Visit: Payer: Managed Care, Other (non HMO) | Attending: Obstetrics

## 2021-11-25 DIAGNOSIS — Z3A22 22 weeks gestation of pregnancy: Secondary | ICD-10-CM | POA: Diagnosis not present

## 2021-11-25 DIAGNOSIS — O09812 Supervision of pregnancy resulting from assisted reproductive technology, second trimester: Secondary | ICD-10-CM

## 2021-11-25 DIAGNOSIS — O09292 Supervision of pregnancy with other poor reproductive or obstetric history, second trimester: Secondary | ICD-10-CM | POA: Diagnosis not present

## 2021-11-25 DIAGNOSIS — O09522 Supervision of elderly multigravida, second trimester: Secondary | ICD-10-CM | POA: Diagnosis not present

## 2021-11-25 DIAGNOSIS — E669 Obesity, unspecified: Secondary | ICD-10-CM | POA: Diagnosis not present

## 2021-11-25 DIAGNOSIS — O99212 Obesity complicating pregnancy, second trimester: Secondary | ICD-10-CM | POA: Insufficient documentation

## 2021-12-16 ENCOUNTER — Other Ambulatory Visit: Payer: Self-pay

## 2021-12-16 ENCOUNTER — Encounter: Payer: Self-pay | Admitting: Obstetrics and Gynecology

## 2021-12-16 ENCOUNTER — Ambulatory Visit (INDEPENDENT_AMBULATORY_CARE_PROVIDER_SITE_OTHER): Payer: Managed Care, Other (non HMO) | Admitting: Obstetrics and Gynecology

## 2021-12-16 VITALS — BP 102/68 | HR 80 | Wt 189.9 lb

## 2021-12-16 DIAGNOSIS — Z3A25 25 weeks gestation of pregnancy: Secondary | ICD-10-CM

## 2021-12-16 DIAGNOSIS — O09522 Supervision of elderly multigravida, second trimester: Secondary | ICD-10-CM

## 2021-12-16 DIAGNOSIS — B009 Herpesviral infection, unspecified: Secondary | ICD-10-CM

## 2021-12-16 LAB — POCT URINALYSIS DIPSTICK OB
Appearance: NORMAL
Bilirubin, UA: NEGATIVE
Blood, UA: NEGATIVE
Glucose, UA: NEGATIVE
Ketones, UA: NEGATIVE
Leukocytes, UA: NEGATIVE
Nitrite, UA: NEGATIVE
Odor: NORMAL
POC,PROTEIN,UA: NEGATIVE
Spec Grav, UA: 1.01 (ref 1.010–1.025)
Urobilinogen, UA: 0.2 E.U./dL
pH, UA: 6 (ref 5.0–8.0)

## 2021-12-16 MED ORDER — VALACYCLOVIR HCL 1 G PO TABS: 1000.0000 mg | ORAL_TABLET | Freq: Every day | ORAL | 0 refills | Status: DC

## 2021-12-16 NOTE — Progress Notes (Signed)
ROB. Patient states fetal movement with no pain or pressure at this time.  Patient states no questions or concerns at this time.

## 2021-12-16 NOTE — Progress Notes (Signed)
ROB: Doing well.  Daily fetal movement.  Previous echo normal.  Needs 1 hour GCT with next visit.  (2 weeks) previous myomectomy-patient to obtain records from Dr. Kerin Perna regarding location of myomectomy.  Possibly needs a cesarean delivery at 37 weeks depending on incision type and location.

## 2021-12-28 ENCOUNTER — Other Ambulatory Visit: Payer: Self-pay

## 2021-12-28 DIAGNOSIS — O09523 Supervision of elderly multigravida, third trimester: Secondary | ICD-10-CM

## 2021-12-28 DIAGNOSIS — O09299 Supervision of pregnancy with other poor reproductive or obstetric history, unspecified trimester: Secondary | ICD-10-CM

## 2021-12-28 DIAGNOSIS — O09813 Supervision of pregnancy resulting from assisted reproductive technology, third trimester: Secondary | ICD-10-CM

## 2021-12-28 DIAGNOSIS — O99213 Obesity complicating pregnancy, third trimester: Secondary | ICD-10-CM

## 2021-12-29 ENCOUNTER — Ambulatory Visit (INDEPENDENT_AMBULATORY_CARE_PROVIDER_SITE_OTHER): Payer: Managed Care, Other (non HMO) | Admitting: Obstetrics and Gynecology

## 2021-12-29 ENCOUNTER — Other Ambulatory Visit: Payer: Managed Care, Other (non HMO)

## 2021-12-29 ENCOUNTER — Encounter: Payer: Self-pay | Admitting: Obstetrics and Gynecology

## 2021-12-29 VITALS — BP 103/70 | HR 89 | Wt 190.0 lb

## 2021-12-29 DIAGNOSIS — Z23 Encounter for immunization: Secondary | ICD-10-CM

## 2021-12-29 DIAGNOSIS — O09522 Supervision of elderly multigravida, second trimester: Secondary | ICD-10-CM

## 2021-12-29 DIAGNOSIS — Z131 Encounter for screening for diabetes mellitus: Secondary | ICD-10-CM

## 2021-12-29 DIAGNOSIS — Z113 Encounter for screening for infections with a predominantly sexual mode of transmission: Secondary | ICD-10-CM

## 2021-12-29 DIAGNOSIS — Z130 Encounter for screening for diseases of the blood and blood-forming organs and certain disorders involving the immune mechanism: Secondary | ICD-10-CM

## 2021-12-29 DIAGNOSIS — O0990 Supervision of high risk pregnancy, unspecified, unspecified trimester: Secondary | ICD-10-CM

## 2021-12-29 DIAGNOSIS — Z8619 Personal history of other infectious and parasitic diseases: Secondary | ICD-10-CM

## 2021-12-29 DIAGNOSIS — Z3A27 27 weeks gestation of pregnancy: Secondary | ICD-10-CM

## 2021-12-29 HISTORY — DX: Personal history of other infectious and parasitic diseases: Z86.19

## 2021-12-29 LAB — POCT URINALYSIS DIPSTICK OB
Bilirubin, UA: NEGATIVE
Blood, UA: NEGATIVE
Glucose, UA: NEGATIVE
Ketones, UA: NEGATIVE
Leukocytes, UA: NEGATIVE
Nitrite, UA: NEGATIVE
POC,PROTEIN,UA: NEGATIVE
Spec Grav, UA: 1.01 (ref 1.010–1.025)
Urobilinogen, UA: 0.2 E.U./dL
pH, UA: 7 (ref 5.0–8.0)

## 2021-12-29 NOTE — Progress Notes (Signed)
ROB: Patient doing well, no issues. For 28 week labs today.  Plans to breastfeed, desires Undecided method for contraception. For Tdap today, signed blood consent. Received records from [facility], notes she is ok to have vaginal delivery after previous myomectomy. Has questions about touring labor facility. RTC in 2 weeks.

## 2021-12-30 ENCOUNTER — Other Ambulatory Visit: Payer: Self-pay

## 2021-12-30 ENCOUNTER — Ambulatory Visit: Payer: Managed Care, Other (non HMO) | Attending: Obstetrics and Gynecology

## 2021-12-30 ENCOUNTER — Encounter: Payer: Self-pay | Admitting: Obstetrics and Gynecology

## 2021-12-30 DIAGNOSIS — O09813 Supervision of pregnancy resulting from assisted reproductive technology, third trimester: Secondary | ICD-10-CM

## 2021-12-30 DIAGNOSIS — O99213 Obesity complicating pregnancy, third trimester: Secondary | ICD-10-CM

## 2021-12-30 DIAGNOSIS — O99212 Obesity complicating pregnancy, second trimester: Secondary | ICD-10-CM

## 2021-12-30 DIAGNOSIS — O09292 Supervision of pregnancy with other poor reproductive or obstetric history, second trimester: Secondary | ICD-10-CM | POA: Diagnosis not present

## 2021-12-30 DIAGNOSIS — O402XX0 Polyhydramnios, second trimester, not applicable or unspecified: Secondary | ICD-10-CM | POA: Diagnosis not present

## 2021-12-30 DIAGNOSIS — O09812 Supervision of pregnancy resulting from assisted reproductive technology, second trimester: Secondary | ICD-10-CM | POA: Diagnosis not present

## 2021-12-30 DIAGNOSIS — O09523 Supervision of elderly multigravida, third trimester: Secondary | ICD-10-CM

## 2021-12-30 DIAGNOSIS — O368120 Decreased fetal movements, second trimester, not applicable or unspecified: Secondary | ICD-10-CM | POA: Diagnosis not present

## 2021-12-30 DIAGNOSIS — Z362 Encounter for other antenatal screening follow-up: Secondary | ICD-10-CM | POA: Diagnosis not present

## 2021-12-30 DIAGNOSIS — O09299 Supervision of pregnancy with other poor reproductive or obstetric history, unspecified trimester: Secondary | ICD-10-CM

## 2021-12-30 DIAGNOSIS — O99214 Obesity complicating childbirth: Secondary | ICD-10-CM | POA: Insufficient documentation

## 2021-12-30 DIAGNOSIS — O409XX0 Polyhydramnios, unspecified trimester, not applicable or unspecified: Secondary | ICD-10-CM

## 2021-12-30 DIAGNOSIS — E669 Obesity, unspecified: Secondary | ICD-10-CM

## 2021-12-30 DIAGNOSIS — O09522 Supervision of elderly multigravida, second trimester: Secondary | ICD-10-CM | POA: Diagnosis not present

## 2021-12-30 DIAGNOSIS — Z3A27 27 weeks gestation of pregnancy: Secondary | ICD-10-CM | POA: Diagnosis not present

## 2021-12-30 LAB — CBC
Hematocrit: 37.5 % (ref 34.0–46.6)
Hemoglobin: 12.3 g/dL (ref 11.1–15.9)
MCH: 29.1 pg (ref 26.6–33.0)
MCHC: 32.8 g/dL (ref 31.5–35.7)
MCV: 89 fL (ref 79–97)
Platelets: 199 10*3/uL (ref 150–450)
RBC: 4.22 x10E6/uL (ref 3.77–5.28)
RDW: 13.1 % (ref 11.7–15.4)
WBC: 6.5 10*3/uL (ref 3.4–10.8)

## 2021-12-30 LAB — GLUCOSE, 1 HOUR GESTATIONAL: Gestational Diabetes Screen: 135 mg/dL (ref 70–139)

## 2021-12-30 LAB — RPR: RPR Ser Ql: NONREACTIVE

## 2021-12-30 MED ORDER — BLOOD GLUCOSE METER KIT: PACK | 3 refills | Status: DC

## 2021-12-30 NOTE — Progress Notes (Signed)
Glucose monitor ordered per Dr. Amalia Hailey. Patient has diagnosed with excess amniotic fluid. Declined 3 hour GCT. Instructed to take fasting and two hours after each meal. Patient verbalized understanding.

## 2022-01-13 ENCOUNTER — Ambulatory Visit (INDEPENDENT_AMBULATORY_CARE_PROVIDER_SITE_OTHER): Payer: Managed Care, Other (non HMO) | Admitting: Obstetrics

## 2022-01-13 VITALS — BP 108/66 | HR 81 | Wt 188.0 lb

## 2022-01-13 DIAGNOSIS — O0993 Supervision of high risk pregnancy, unspecified, third trimester: Secondary | ICD-10-CM

## 2022-01-13 DIAGNOSIS — O403XX0 Polyhydramnios, third trimester, not applicable or unspecified: Secondary | ICD-10-CM

## 2022-01-13 DIAGNOSIS — Z3A29 29 weeks gestation of pregnancy: Secondary | ICD-10-CM

## 2022-01-13 NOTE — Progress Notes (Signed)
ROB at [redacted]w[redacted]d Active baby. Reviewed MFM recommendations. AIdellahas been keeping track of her blood sugar. Declines 3-hour GTT. She has 3 elevated fasting levels and a few elevated PP. Will continue to track. [place] Discussed polyhydramnios and AMA fetal surveillance. Questions answered. MFM f/u scheduled. RTC in 2 weeks.  MLloyd Huger CNM

## 2022-01-17 ENCOUNTER — Encounter: Payer: Managed Care, Other (non HMO) | Attending: Obstetrics | Admitting: *Deleted

## 2022-01-17 ENCOUNTER — Encounter: Payer: Self-pay | Admitting: *Deleted

## 2022-01-17 VITALS — BP 106/60 | Ht 64.0 in | Wt 186.5 lb

## 2022-01-17 DIAGNOSIS — Z713 Dietary counseling and surveillance: Secondary | ICD-10-CM | POA: Insufficient documentation

## 2022-01-17 DIAGNOSIS — O403XX0 Polyhydramnios, third trimester, not applicable or unspecified: Secondary | ICD-10-CM | POA: Insufficient documentation

## 2022-01-17 DIAGNOSIS — O24410 Gestational diabetes mellitus in pregnancy, diet controlled: Secondary | ICD-10-CM

## 2022-01-17 NOTE — Progress Notes (Unsigned)
Diabetes Self-Management Education  Visit Type: First/Initial  Appt. Start Time: 1510 Appt. End Time: 1941  01/17/2022  [name], identified by name and date of birth, is a 46 y.o. female with a diagnosis of Diabetes: Gestational Diabetes.   ASSESSMENT  Blood pressure 106/60, height '5\' 4"'$  (1.626 m), weight 186 lb 8 oz (84.6 kg), last menstrual period 06/18/2021, estimated date of delivery 03/25/2022 Body mass index is 32.01 kg/m.   Diabetes Self-Management Education - 01/17/22 1951       Visit Information   Visit Type First/Initial      Initial Visit   Diabetes Type Gestational Diabetes    Date Diagnosed 1 week    Are you currently following a meal plan? Yes    What type of meal plan do you follow? "low carb, low sugar"    Are you taking your medications as prescribed? Yes      Health Coping   How would you rate your overall health? Good      Psychosocial Assessment   Patient Belief/Attitude about Diabetes Other (comment)   "I don't like it"   What is the hardest part about your diabetes right now, causing you the most concern, or is the most worrisome to you about your diabetes?   Making healty food and beverage choices    Self-care barriers None    Self-management support Doctor's office;Family    Patient Concerns Nutrition/Meal planning;Glycemic Control;Monitoring    Special Needs None    Preferred Learning Style Other (comment)   talking/discussion   Learning Readiness Change in progress    How often do you need to have someone help you when you read instructions, pamphlets, or other written materials from your doctor or pharmacy? 1 - Never    What is the last grade level you completed in school? Masters      Pre-Education Assessment   Patient understands the diabetes disease and treatment process. Needs Instruction    Patient understands incorporating nutritional management into lifestyle. Needs Instruction    Patient undertands incorporating physical  activity into lifestyle. Needs Review    Patient understands using medications safely. Needs Instruction    Patient understands monitoring blood glucose, interpreting and using results Needs Review    Patient understands prevention, detection, and treatment of acute complications. Needs Instruction    Patient understands prevention, detection, and treatment of chronic complications. Needs Instruction    Patient understands how to develop strategies to address psychosocial issues. Needs Instruction    Patient understands how to develop strategies to promote health/change behavior. Needs Instruction      Complications   How often do you check your blood sugar? 3-4 times/day    Fasting Blood glucose range (mg/dL) 70-129   She deleted previous numbers but her readings from 11/26-12/4 showed FBG's 90-97 mg/dL. 3/5 elevated   Postprandial Blood glucose range (mg/dL) 70-129;130-179   pp's 92-170 mg/dL 2/9 elevated after eating at [facility] and pizza   Have you had a dilated eye exam in the past 12 months? Yes    Have you had a dental exam in the past 12 months? Yes    Are you checking your feet? Yes    How many days per week are you checking your feet? 1      Dietary Intake   Breakfast egg and 2 pieces of toast or egg biscuit with fruit (apple, banana, strawberries, blueberries)    Snack (morning) fruit or chips    Lunch chipotle, Janine Limbo,  chicken and rice with veggies    Snack (afternoon) same as am    Dinner chicken, beef, fish; potatoes, peas, beans, corn, rice, green beans, broccoli, squahs, zucchini, salads with romaine lettuce, tomatoes, cheese, cuccumbers, peppers    Snack (evening) [place] yogurt, cereal and non-sweetened almond milk    Beverage(s) water      Activity / Exercise   Activity / Exercise Type Light (walking / raking leaves)    How many days per week do you exercise? 4.5    How many minutes per day do you exercise? 30    Total minutes per week of exercise 135       Patient Education   Previous Diabetes Education No    Disease Pathophysiology Definition of diabetes, type 1 and 2, and the diagnosis of diabetes;Factors that contribute to the development of diabetes    Healthy Eating Role of diet in the treatment of diabetes and the relationship between the three main macronutrients and blood glucose level;Food label reading, portion sizes and measuring food.;Carbohydrate counting;Reviewed blood glucose goals for pre and post meals and how to evaluate the patients' food intake on their blood glucose level.    Being Active Role of exercise on diabetes management, blood pressure control and cardiac health.    Medications Other (comment)   Limited use oral medications during pregnancy and potential for insulin.   Monitoring Purpose and frequency of SMBG.;Taught/discussed recording of test results and interpretation of SMBG.;Identified appropriate SMBG and/or A1C goals.;Ketone testing, when, how.    Chronic complications Relationship between chronic complications and blood glucose control    Diabetes Stress and Support Identified and addressed patients feelings and concerns about diabetes;Role of stress on diabetes    Preconception care Pregnancy and GDM  Role of pre-pregnancy blood glucose control on the development of the fetus;Reviewed with patient blood glucose goals with pregnancy;Role of family planning for patients with diabetes      Individualized Goals (developed by patient)   Reducing Risk Other (comment)   improve blood sugars, prevent diabetes complications     Outcomes   Expected Outcomes Demonstrated interest in learning. Expect positive outcomes    Future DMSE 2 wks         Individualized Plan for Diabetes Self-Management Training:   Learning Objective:  Patient will have a greater understanding of diabetes self-management. Patient education plan is to attend individual and/or group sessions per assessed needs and concerns.   Plan:   Read  booklet on Gestational Diabetes Follow Gestational Meal Planning Guidelines Allow 2-3 hours between meals and snacks Include 1 serving of protein with meals and snacks Avoid cold cereal if blood sugars elevated Complete a 3 Day Food Record and bring to next appointment Check blood sugars 4 x day - before breakfast and 2 hrs after every meal and record  Bring blood sugar log to all appointments Purchase urine ketone strips if instructed by MD and check urine ketones every am:  If + increase bedtime snack to 1 protein and 2 carbohydrate servings Walk 20-30 minutes at least 5 x week if permitted by MD  Expected Outcomes:  Demonstrated interest in learning. Expect positive outcomes  Education material provided:  Gestational Booklet Gestational Meal Planning Guidelines Simple Meal Plan 3 Day Food Record Goals for a Healthy Pregnancy Making Choices Using Food Labels (ADA)  If problems or questions, patient to contact team via:   Johny Drilling, RN, [place] [phone]  Future DSME appointment: 2 wks January 31, 2022 with  the dietitian

## 2022-01-18 NOTE — Patient Instructions (Signed)
Read booklet on Gestational Diabetes Follow Gestational Meal Planning Guidelines Allow 2-3 hours between meals and snacks Include 1 serving of protein with meals and snacks Avoid cold cereal if blood sugars elevated Complete a 3 Day Food Record and bring to next appointment Check blood sugars 4 x day - before breakfast and 2 hrs after every meal and record  Bring blood sugar log to all appointments Purchase urine ketone strips if instructed by MD and check urine ketones every am:  If + increase bedtime snack to 1 protein and 2 carbohydrate servings Walk 20-30 minutes at least 5 x week if permitted by MD

## 2022-01-19 ENCOUNTER — Ambulatory Visit: Payer: Managed Care, Other (non HMO) | Admitting: *Deleted

## 2022-01-19 ENCOUNTER — Ambulatory Visit (HOSPITAL_BASED_OUTPATIENT_CLINIC_OR_DEPARTMENT_OTHER): Payer: Managed Care, Other (non HMO) | Admitting: Maternal & Fetal Medicine

## 2022-01-19 ENCOUNTER — Encounter: Payer: Self-pay | Admitting: Obstetrics and Gynecology

## 2022-01-19 ENCOUNTER — Ambulatory Visit: Payer: Managed Care, Other (non HMO) | Attending: Obstetrics and Gynecology

## 2022-01-19 VITALS — BP 128/68 | HR 90

## 2022-01-19 DIAGNOSIS — O09523 Supervision of elderly multigravida, third trimester: Secondary | ICD-10-CM | POA: Diagnosis present

## 2022-01-19 DIAGNOSIS — O09813 Supervision of pregnancy resulting from assisted reproductive technology, third trimester: Secondary | ICD-10-CM

## 2022-01-19 DIAGNOSIS — Z3A25 25 weeks gestation of pregnancy: Secondary | ICD-10-CM

## 2022-01-19 DIAGNOSIS — O99213 Obesity complicating pregnancy, third trimester: Secondary | ICD-10-CM | POA: Diagnosis not present

## 2022-01-19 DIAGNOSIS — Z362 Encounter for other antenatal screening follow-up: Secondary | ICD-10-CM

## 2022-01-19 DIAGNOSIS — O403XX0 Polyhydramnios, third trimester, not applicable or unspecified: Secondary | ICD-10-CM

## 2022-01-19 DIAGNOSIS — E669 Obesity, unspecified: Secondary | ICD-10-CM | POA: Diagnosis not present

## 2022-01-19 DIAGNOSIS — Z3A30 30 weeks gestation of pregnancy: Secondary | ICD-10-CM

## 2022-01-19 NOTE — Progress Notes (Signed)
MFM Consult Note Patient Name: Cheryl Macias  Patient MRN:   [id number]  Referring provider: [facility]  Reason for Consult: elevated BP   HPI: Cheryl Macias is a 46 y.o. G2P0010 at 33w2dfor an ultrasound for an FGR fetus.    Cheryl's blood pressure was elevated today at 137/90 with a repeat of 143/93.  She denies headache, vision changes, shortness of breath, chest pain, right upper quadrant pain or swelling in her lower extremities.  She is aware of the association with preeclampsia and growth restriction.  We discussed the importance of having laboratory assessment to rule out preeclampsia with severe features today.  She will head straight to the MAU for assessment.  Also discussed that she can have a nonstress test when she arrives to ensure fetal wellbeing.  Today's ultrasound showed normal fluid, mostly upper limit of normal umbilical artery Dopplers for few that were elevated.  There was good fetal movement and tone.  I also discussed the importance of checking her blood pressure at home and gave her parameters on when to go to the hospital.  Her husband has a blood pressure cuff that she will use and she will record her blood pressure twice a day.  Review of Systems: A review of systems was performed and was negative except per HPI   Vitals and Physical Exam See intake sheet for vitals Sitting comfortably on the sonogram table Nonlabored breathing Normal rate and rhythm Abdomen is nontender  Sonographic findings Single intrauterine pregnancy at 25w 2d.  Observed fetal cardiac activity. Breech presentation. Interval fetal anatomy appears normal.  Amniotic fluid volume: Within normal limits.  Placenta is posterior. Umbilical artery dopplers findings: -S/D:5.2 which are at the upper limit of normal with some that were elevated at this gestational age.  -Absent end-diastolic flow: No.  -Reversed end-diastolic flow: No. Normal movement and tone noted.  Assessment -  FGR - Elevated BP Plan - Sent to MAU for blood work (preeclampsia labs) and NST - Continue weekly UA dopplers until delivery.  - Continue to avoid tobacco use and exposure to harmful substances - Weekly NST starting next week. - Weekly BPP to start at 32 weeks.   - Betamethasone indicated if absent or reversed flow is seen or antenatal testing is abnormal in the future  - Serial growth [place]ore[place]very 3 weeks until delivery  - Delivery likley around 37 weeks or sooner if inidcated.  I spent 30 minutes reviewing the patients chart, including labs and images as well as counseling the patient about her medical conditions.  BValeda Malm MFM, [place]  01/19/2022  3:05 PM

## 2022-01-25 DIAGNOSIS — O403XX0 Polyhydramnios, third trimester, not applicable or unspecified: Secondary | ICD-10-CM | POA: Insufficient documentation

## 2022-01-25 DIAGNOSIS — Z3A31 31 weeks gestation of pregnancy: Secondary | ICD-10-CM | POA: Insufficient documentation

## 2022-01-25 DIAGNOSIS — O09523 Supervision of elderly multigravida, third trimester: Secondary | ICD-10-CM | POA: Insufficient documentation

## 2022-01-25 NOTE — Patient Instructions (Signed)

## 2022-01-25 NOTE — Progress Notes (Unsigned)
    NURSE VISIT NOTE  Subjective:    Patient ID: Cheryl Macias, female    DOB: July 26, 1975, 46 y.o.   MRN: [id number]  HPI  Patient is a 46 y.o. G27P2011 female who presents for fetal monitoring per order from Cheryl Macias, CNM.   Objective:    BP 113/70   Pulse 86   Ht '5\' 4"'$  (1.626 m)   Wt 186 lb (84.4 kg)   LMP 06/18/2021 (Exact Date)   BMI 31.93 kg/m  Estimated Date of Delivery: 03/25/22  Assessment:   1. Supervision of high risk pregnancy in third trimester   2. Polyhydramnios affecting pregnancy in third trimester   3. AMA (advanced maternal age) multigravida 12+, third trimester   4. [redacted] weeks gestation of pregnancy      Plan:   Results reviewed and discussed with patient by  Cheryl Macias, CNM.     Cheryl Kluver, LPN

## 2022-01-26 ENCOUNTER — Ambulatory Visit (INDEPENDENT_AMBULATORY_CARE_PROVIDER_SITE_OTHER): Payer: Managed Care, Other (non HMO) | Admitting: Certified Nurse Midwife

## 2022-01-26 ENCOUNTER — Ambulatory Visit: Payer: Managed Care, Other (non HMO)

## 2022-01-26 ENCOUNTER — Encounter: Payer: Self-pay | Admitting: Certified Nurse Midwife

## 2022-01-26 VITALS — BP 113/70 | HR 86 | Ht 64.0 in | Wt 186.0 lb

## 2022-01-26 VITALS — BP 113/70 | HR 86 | Wt 186.0 lb

## 2022-01-26 DIAGNOSIS — Z3A31 31 weeks gestation of pregnancy: Secondary | ICD-10-CM | POA: Diagnosis not present

## 2022-01-26 DIAGNOSIS — O09523 Supervision of elderly multigravida, third trimester: Secondary | ICD-10-CM

## 2022-01-26 DIAGNOSIS — O403XX0 Polyhydramnios, third trimester, not applicable or unspecified: Secondary | ICD-10-CM

## 2022-01-26 DIAGNOSIS — O0993 Supervision of high risk pregnancy, unspecified, third trimester: Secondary | ICD-10-CM

## 2022-01-26 LAB — POCT URINALYSIS DIPSTICK OB
Appearance: NORMAL
Bilirubin, UA: NEGATIVE
Blood, UA: NEGATIVE
Glucose, UA: NEGATIVE
Ketones, UA: NEGATIVE
Leukocytes, UA: NEGATIVE
Nitrite, UA: NEGATIVE
Odor: NORMAL
Spec Grav, UA: 1.01 (ref 1.010–1.025)
Urobilinogen, UA: 0.2 E.U./dL
pH, UA: 6 (ref 5.0–8.0)

## 2022-01-26 NOTE — Progress Notes (Signed)
ROB doing well, feeling regular movement. NST today reactive .   Baseline : 130 Accelerations present Decelerations absent Moderate variability   Ctx: absent   BS log reviewed:  Fasting 2 elevated 96, 97 2 hr pp 3 elevated 170,150,145 -pt admits it was due to dietary choices . States she ate out ( [place], [place]).    Reviewed with Dr. Marcelline Mates .   Follow up 1 wk for NST, Has MFM BPP/dopplers  scheduled 12/28.  Philip Aspen, CNM

## 2022-01-26 NOTE — Progress Notes (Signed)
ROB [redacted]w[redacted]d Doing well. Wishes to discuss monitoring glucose and polyhydramnios.

## 2022-01-26 NOTE — Patient Instructions (Signed)

## 2022-01-27 ENCOUNTER — Encounter: Payer: Self-pay | Admitting: Certified Nurse Midwife

## 2022-01-27 ENCOUNTER — Other Ambulatory Visit: Payer: Self-pay

## 2022-01-27 ENCOUNTER — Encounter: Payer: Managed Care, Other (non HMO) | Admitting: Licensed Practical Nurse

## 2022-01-27 DIAGNOSIS — O403XX0 Polyhydramnios, third trimester, not applicable or unspecified: Secondary | ICD-10-CM

## 2022-01-27 DIAGNOSIS — O09523 Supervision of elderly multigravida, third trimester: Secondary | ICD-10-CM

## 2022-01-28 ENCOUNTER — Ambulatory Visit: Payer: Managed Care, Other (non HMO)

## 2022-01-31 ENCOUNTER — Encounter: Payer: Self-pay | Admitting: Dietician

## 2022-01-31 ENCOUNTER — Encounter: Payer: Managed Care, Other (non HMO) | Admitting: Dietician

## 2022-01-31 VITALS — BP 100/68 | Ht 64.0 in | Wt 185.8 lb

## 2022-01-31 DIAGNOSIS — O24410 Gestational diabetes mellitus in pregnancy, diet controlled: Secondary | ICD-10-CM

## 2022-01-31 DIAGNOSIS — Z3A32 32 weeks gestation of pregnancy: Secondary | ICD-10-CM | POA: Insufficient documentation

## 2022-01-31 DIAGNOSIS — Z713 Dietary counseling and surveillance: Secondary | ICD-10-CM | POA: Diagnosis not present

## 2022-01-31 NOTE — Patient Instructions (Signed)

## 2022-01-31 NOTE — Progress Notes (Unsigned)
    NURSE VISIT NOTE  Subjective:    Patient ID: Cheryl Macias, female    DOB: 1975-07-18, 46 y.o.   MRN: [id number]  HPI  Patient is a 46 y.o. G52P2011 female who presents for fetal monitoring per order from {AOBProviders:28529}.   Objective:    LMP 06/18/2021 (Exact Date)  Estimated Date of Delivery: 03/25/22  Assessment:   1. Supervision of high risk pregnancy in third trimester   2. Polyhydramnios affecting pregnancy in third trimester   3. AMA (advanced maternal age) multigravida 44+, third trimester   4. [redacted] weeks gestation of pregnancy      Plan:   Results reviewed and discussed with patient by  {AOBProviders:28529}.     Otila Kluver, LPN

## 2022-01-31 NOTE — Progress Notes (Signed)
Patient's reported BG results (dictated from her phone) indicate fasting BGs ranging in low 90s, and post-meal BGs ranging usually <120 but some >120 especially after restaurant/ higher carb meals. Highest was 185 after restaurant meal during which she had no water/ fluids.  Patient's food diary indicates consumption of 3 meals daily, with occasional snacks. Some meals above goal for carbs and some meals lacking protein source. Generally healthy food choices, some high fiber foods, mostly low fat foods.  Provided basic balanced meal plan, and wrote individualized menus based on patient's food preferences. Instructed on carb goal with meals and snacks and options for proteins. Instructed patient on proper treatment for low blood sugar reactions.  Instructed patient on food safety, including avoidance of Listeriosis, and limiting mercury from fish. Discussed importance of maintaining healthy lifestyle habits to reduce risk of Type 2 DM as well as Gestational DM with any future pregnancies. Advised patient to use any remaining testing supplies to test some BGs after delivery, and to have BG tested ideally annually, as well as prior to attempting future pregnancies.

## 2022-02-01 ENCOUNTER — Ambulatory Visit (INDEPENDENT_AMBULATORY_CARE_PROVIDER_SITE_OTHER): Payer: Managed Care, Other (non HMO)

## 2022-02-01 ENCOUNTER — Ambulatory Visit (INDEPENDENT_AMBULATORY_CARE_PROVIDER_SITE_OTHER): Payer: Managed Care, Other (non HMO) | Admitting: Obstetrics & Gynecology

## 2022-02-01 ENCOUNTER — Encounter: Payer: Self-pay | Admitting: Certified Nurse Midwife

## 2022-02-01 VITALS — BP 110/69 | HR 77 | Wt 188.9 lb

## 2022-02-01 VITALS — BP 110/69 | HR 70 | Ht 64.0 in | Wt 188.9 lb

## 2022-02-01 DIAGNOSIS — O24410 Gestational diabetes mellitus in pregnancy, diet controlled: Secondary | ICD-10-CM

## 2022-02-01 DIAGNOSIS — Z3A32 32 weeks gestation of pregnancy: Secondary | ICD-10-CM

## 2022-02-01 DIAGNOSIS — O09523 Supervision of elderly multigravida, third trimester: Secondary | ICD-10-CM

## 2022-02-01 DIAGNOSIS — O24419 Gestational diabetes mellitus in pregnancy, unspecified control: Secondary | ICD-10-CM | POA: Insufficient documentation

## 2022-02-01 DIAGNOSIS — O0993 Supervision of high risk pregnancy, unspecified, third trimester: Secondary | ICD-10-CM

## 2022-02-01 DIAGNOSIS — O09529 Supervision of elderly multigravida, unspecified trimester: Secondary | ICD-10-CM

## 2022-02-01 DIAGNOSIS — O403XX0 Polyhydramnios, third trimester, not applicable or unspecified: Secondary | ICD-10-CM

## 2022-02-01 DIAGNOSIS — Z8619 Personal history of other infectious and parasitic diseases: Secondary | ICD-10-CM

## 2022-02-01 LAB — POCT URINALYSIS DIPSTICK OB
Appearance: NORMAL
Bilirubin, UA: NEGATIVE
Blood, UA: NEGATIVE
Glucose, UA: NEGATIVE
Ketones, UA: NEGATIVE
Leukocytes, UA: NEGATIVE
Nitrite, UA: NEGATIVE
Odor: NORMAL
Spec Grav, UA: 1.02 (ref 1.010–1.025)
Urobilinogen, UA: 0.2 E.U./dL
pH, UA: 6 (ref 5.0–8.0)

## 2022-02-01 NOTE — Progress Notes (Signed)
   PRENATAL VISIT NOTE  Subjective:  Cheryl Macias is a 46 y.o. G4P2011 at 53w4dbeing seen today for ongoing prenatal care.  She is currently monitored for the following issues for this high-risk pregnancy and has Cold sore; Osteoarthritis of knee; Pregnancy, supervision, high-risk; Elderly multigravida, currently pregnant; Family history of genetic disorder; Newborn product of in vitro fertilization (IVF) pregnancy; History of herpes genitalis; Polyhydramnios affecting pregnancy in third trimester; AMA (advanced maternal age) multigravida 35+, third trimester; [redacted] weeks gestation of pregnancy; [redacted] weeks gestation of pregnancy; and Diet controlled gestational diabetes mellitus (GDM) in third trimester on their problem list.  Patient reports no complaints.  Contractions: Irritability.  .  Movement: Present. Denies leaking of fluid.   The following portions of the patient's history were reviewed and updated as appropriate: allergies, current medications, past family history, past medical history, past social history, past surgical history and problem list.   Objective:   Vitals:   02/01/22 1442  BP: 110/69  Pulse: 77  Weight: 188 lb 14.4 oz (85.7 kg)    Fetal Status:     Movement: Present     General:  Alert, oriented and cooperative. Patient is in no acute distress.  Skin: Skin is warm and dry. No rash noted.   Cardiovascular: Normal heart rate noted  Respiratory: Normal respiratory effort, no problems with respiration noted  Abdomen: Soft, gravid, appropriate for gestational age.  Pain/Pressure: Absent     Pelvic: Cervical exam deferred        Extremities: Normal range of motion.     Mental Status: Normal mood and affect. Normal behavior. Normal judgment and thought content.   NST reactive FH- 36 cm  Assessment and Plan:  Pregnancy: [id number] 326w4d. Supervision of high risk pregnancy in third trimester  - POC Urinalysis Dipstick OB  2. Polyhydramnios affecting pregnancy in  third trimester - she has weekly BPPs scheduled with MFM starting next week   3. AMA (advanced maternal age) multigravida 3566+third trimester - she had normal MAT 2190 MFM rec delivery by 39 weeks  4. [redacted] weeks gestation of pregnancy  5. Diet controlled gestational diabetes mellitus (GDM) in third trimester  - her 1 hour GTT was 135, she has been testing her sugars, almost all are within range  6.  History of herpes genitalis - she will need to start retaking her valtrex around 34 weeks   Preterm labor symptoms and general obstetric precautions including but not limited to vaginal bleeding, contractions, leaking of fluid and fetal movement were reviewed in detail with the patient. Please refer to After Visit Summary for other counseling recommendations.   Return in about 2 weeks (around 02/15/2022).  Future Appointments  Date Time Provider DeTyler Run12/27/2023  2:15 PM StBrendolyn PattyMD [facility] None  02/10/2022 10:00 AM [facility] [place]20[place]  4:00 PM [facility]  02/24/2022  4:00 PM [facility]    MyEmily FilbertMD

## 2022-02-09 ENCOUNTER — Other Ambulatory Visit: Payer: Self-pay

## 2022-02-09 ENCOUNTER — Ambulatory Visit: Payer: Managed Care, Other (non HMO) | Admitting: Dermatology

## 2022-02-09 ENCOUNTER — Encounter: Payer: Self-pay | Admitting: Dermatology

## 2022-02-09 DIAGNOSIS — Z1283 Encounter for screening for malignant neoplasm of skin: Secondary | ICD-10-CM | POA: Diagnosis not present

## 2022-02-09 DIAGNOSIS — D2371 Other benign neoplasm of skin of right lower limb, including hip: Secondary | ICD-10-CM | POA: Diagnosis not present

## 2022-02-09 DIAGNOSIS — D229 Melanocytic nevi, unspecified: Secondary | ICD-10-CM

## 2022-02-09 DIAGNOSIS — D239 Other benign neoplasm of skin, unspecified: Secondary | ICD-10-CM

## 2022-02-09 DIAGNOSIS — L820 Inflamed seborrheic keratosis: Secondary | ICD-10-CM | POA: Diagnosis not present

## 2022-02-09 DIAGNOSIS — L821 Other seborrheic keratosis: Secondary | ICD-10-CM

## 2022-02-09 DIAGNOSIS — B070 Plantar wart: Secondary | ICD-10-CM | POA: Diagnosis not present

## 2022-02-09 DIAGNOSIS — D225 Melanocytic nevi of trunk: Secondary | ICD-10-CM | POA: Diagnosis not present

## 2022-02-09 DIAGNOSIS — Z7189 Other specified counseling: Secondary | ICD-10-CM

## 2022-02-09 NOTE — Patient Instructions (Addendum)
Recommend using Curad Mediplast pads. Cut to fit wart. Cover with Elastoplast waterproof tape, or any waterproof band-aid. Change every 3 to 4 days   Cryotherapy Aftercare  Wash gently with soap and water everyday.   Apply Vaseline and Band-Aid daily until healed.    Recommend daily broad spectrum sunscreen SPF 30+ to sun-exposed areas, reapply every 2 hours as needed. Call for new or changing lesions.  Staying in the shade or wearing long sleeves, sun glasses (UVA+UVB protection) and wide brim hats (4-inch brim around the entire circumference of the hat) are also recommended for sun protection.    Melanoma ABCDEs  Melanoma is the most dangerous type of skin cancer, and is the leading cause of death from skin disease.  You are more likely to develop melanoma if you: Have light-colored skin, light-colored eyes, or red or blond hair Spend a lot of time in the sun Tan regularly, either outdoors or in a tanning bed Have had blistering sunburns, especially during childhood Have a close family member who has had a melanoma Have atypical moles or large birthmarks  Early detection of melanoma is key since treatment is typically straightforward and cure rates are extremely high if we catch it early.   The first sign of melanoma is often a change in a mole or a new dark spot.  The ABCDE system is a way of remembering the signs of melanoma.  A for asymmetry:  The two halves do not match. B for border:  The edges of the growth are irregular. C for color:  A mixture of colors are present instead of an even brown color. D for diameter:  Melanomas are usually (but not always) greater than 62m - the size of a pencil eraser. E for evolution:  The spot keeps changing in size, shape, and color.  Please check your skin once per month between visits. You can use a small mirror in front and a large mirror behind you to keep an eye on the back side or your body.   If you see any new or changing lesions  before your next follow-up, please call to schedule a visit.  Please continue daily skin protection including broad spectrum sunscreen SPF 30+ to sun-exposed areas, reapplying every 2 hours as needed when you're outdoors.   Staying in the shade or wearing long sleeves, sun glasses (UVA+UVB protection) and wide brim hats (4-inch brim around the entire circumference of the hat) are also recommended for sun protection.     Due to recent changes in healthcare laws, you may see results of your pathology and/or laboratory studies on MyChart before the doctors have had a chance to review them. We understand that in some cases there may be results that are confusing or concerning to you. Please understand that not all results are received at the same time and often the doctors may need to interpret multiple results in order to provide you with the best plan of care or course of treatment. Therefore, we ask that you please give u[place]2 business days to thoroughly review all your results before contacting the office for clarification. Should we see a critical lab result, you will be contacted sooner.   If You Need Anything After Your Visit  If you have any questions or concerns for your doctor, please call our main line at [phone] press option 4 to reach your doctor's medical assistant. If no one answers, please leave a voicemail as directed and we will return your  call as soon as possible. Messages left after 4 pm will be answered the following business day.   You may also send [place] a message via [place]. We typically respond to MyChart messages within 1-2 business days.  For prescription refills, please ask your pharmacy to contact our office. Our fax number is [phone].  If you have an urgent issue when the clinic is closed that cannot wait until the next business day, you can page your doctor at the number below.    Please note that while we do our best to be available for urgent issues outside  of office hours, we are not available 24/7.   If you have an urgent issue and are unable to reach [place], you may choose to seek medical care at your doctor's office, retail clinic, urgent care center, or emergency room.  If you have a medical emergency, please immediately call 911 or go to the emergency department.  Pager Numbers  - Dr. Nehemiah Massed: [phone]  - Dr. Laurence Ferrari: [phone]  - Dr. Nicole Kindred: [phone]  In the event of inclement weather, please call our main line at [phone] for an update on the status of any delays or closures.  Dermatology Medication Tips: Please keep the boxes that topical medications come in in order to help keep track of the instructions about where and how to use these. Pharmacies typically print the medication instructions only on the boxes and not directly on the medication tubes.   If your medication is too expensive, please contact our office at [phone] option 4 or send [place] a message through [place].   We are unable to tell what your co-pay for medications will be in advance as this is different depending on your insurance coverage. However, we may be able to find a substitute medication at lower cost or fill out paperwork to get insurance to cover a needed medication.   If a prior authorization is required to get your medication covered by your insurance company, please allow [place] 1-2 business days to complete this process.  Drug prices often vary depending on where the prescription is filled and some pharmacies may offer cheaper prices.  The website www.goodrx.com contains coupons for medications through different pharmacies. The prices here do not account for what the cost may be with help from insurance (it may be cheaper with your insurance), but the website can give you the price if you did not use any insurance.  - You can print the associated coupon and take it with your prescription to the pharmacy.  - You may also stop by our office  during regular business hours and pick up a GoodRx coupon card.  - If you need your prescription sent electronically to a different pharmacy, notify our office through [facility] or by phone at [phone] option 4.     Si Usted Necesita Algo Despus de Su Visita  Tambin puede enviarnos un mensaje a travs de Pharmacist, community. Por lo general respondemos a los mensajes de MyChart en el transcurso de 1 a 2 das hbiles.  Para renovar recetas, por favor pida a su farmacia que se ponga en contacto con nuestra oficina. Harland Dingwall de fax es [place] [phone].  Si tiene un asunto urgente cuando la clnica est cerrada y que no puede esperar hasta el siguiente da hbil, puede llamar/localizar a su doctor(a) al nmero que aparece a continuacin.   Por favor, tenga en cuenta que aunque hacemos todo lo posible para estar disponibles para asuntos urgentes fuera  del horario de oficina, no estamos disponibles las 24 horas del da, los 7 das de la [place].   Si tiene un problema urgente y no puede comunicarse con nosotros, puede optar por buscar atencin mdica  en el consultorio de su doctor(a), en una clnica privada, en un centro de atencin urgente o en una sala de emergencias.  Si tiene Engineering geologist, por favor llame inmediatamente al 911 o vaya a la sala de emergencias.  Nmeros de bper  - Dr. Nehemiah Massed: [phone]  - Dra. Moye: [phone]  - Dra. Nicole Kindred: [phone]  En caso de inclemencias del [place], por favor llame a Johnsie Kindred principal al [phone] para una actualizacin sobre el [place] de cualquier retraso o cierre.  Consejos para la medicacin en dermatologa: Por favor, guarde las cajas en las que vienen los medicamentos de uso tpico para ayudarle a seguir las instrucciones sobre dnde y cmo usarlos. Las farmacias generalmente imprimen las instrucciones del medicamento slo en las cajas y no directamente en los tubos del [place].   Si su medicamento es  muy caro, por favor, pngase en contacto con Zigmund Daniel llamando al [phone] y presione la opcin 4 o envenos un mensaje a travs de Pharmacist, community.   No podemos decirle cul ser su copago por los medicamentos por adelantado ya que esto es diferente dependiendo de la cobertura de su seguro. Sin embargo, es posible que podamos encontrar un medicamento sustituto a Electrical engineer un formulario para que el seguro cubra el medicamento que se considera necesario.   Si se requiere una autorizacin previa para que su compaa de seguros [place] su medicamento, por favor permtanos de 1 a 2 das hbiles para completar este proceso.  Los precios de los medicamentos varan con frecuencia dependiendo del Environmental consultant de dnde se surte la receta y alguna farmacias pueden ofrecer precios ms baratos.  El sitio web www.goodrx.com tiene cupones para medicamentos de Airline pilot. Los precios aqu no tienen en cuenta lo que podra costar con la ayuda del seguro (puede ser ms barato con su seguro), pero el sitio web puede darle el precio si no utiliz Research scientist (physical sciences).  - Puede imprimir el cupn correspondiente y llevarlo con su receta a la farmacia.  - Tambin puede pasar por nuestra oficina durante el horario de atencin regular y Charity fundraiser una tarjeta de cupones de GoodRx.  - Si necesita que su receta se enve electrnicamente a una farmacia diferente, informe a nuestra oficina a travs de MyChart de [place] o por telfono llamando al [phone] y presione la opcin 4.

## 2022-02-09 NOTE — Progress Notes (Signed)
New Patient Visit  Subjective  Cheryl Macias is a 46 y.o. female who presents for the following: Annual Exam (Here for skin cancer screening. No personal or family history of dysplastic nevi or skin cancer. Currently pregnant, [facility] 03/20/2021).  The patient presents for Total-Body Skin Exam (TBSE) for skin cancer screening and mole check.  The patient has spots, moles and lesions to be evaluated, some may be new or changing and the patient has concerns that these could be cancer.  Including spot on L ant shoulder that gets irritated by clothing.   Review of Systems: No other skin or systemic complaints except as noted in HPI or Assessment and Plan.   Objective  Well appearing patient in no apparent distress; mood and affect are within normal limits.  A full examination was performed including scalp, head, eyes, ears, nose, lips, neck, chest, axillae, abdomen, back, buttocks, bilateral upper extremities, bilateral lower extremities, hands, feet, fingers, toes, fingernails, and toenails. All findings within normal limits unless otherwise noted below.  left anterior shoulder x2 (2) Erythematous keratotic or waxy stuck-on papule or plaque.  Left Breast, right upper abdomen Large open comedones c/w dilated pores  Right Lateral Plantar Heel Firm keratotic papule 31m  Right Upper Back 322mbrown macule   Assessment & Plan   Dermatofibroma. Right popliteal. 100m70m Firm pink/brown papulenodule with dimple sign - Benign appearing - Call for any changes   Seborrheic Keratoses - Stuck-on, waxy, tan-brown papules and/or plaques  - Benign-appearing - Discussed benign etiology and prognosis. - Observe - Call for any changes  Melanocytic Nevi. - Tan-brown and/or pink-flesh-colored symmetric macules and papules - Benign appearing on exam today - Observation - Call clinic for new or changing moles - Recommend daily use of broad spectrum spf 30+ sunscreen to sun-exposed areas.    Skin  cancer screening performed today.  Inflamed seborrheic keratosis (2) left anterior shoulder x2  Symptomatic, irritating, patient would like treated.  Discussed risk of hypopigmentation with cryotherapy.  Destruction of lesion - left anterior shoulder x2  Destruction method: cryotherapy   Informed consent: discussed and consent obtained   Lesion destroyed using liquid nitrogen: Yes   Region frozen until ice ball extended beyond lesion: Yes   Outcome: patient tolerated procedure well with no complications   Post-procedure details: wound care instructions given   Additional details:  Prior to procedure, discussed risks of blister formation, small wound, skin dyspigmentation, or rare scar following cryotherapy. Recommend Vaseline ointment to treated areas while healing.   Dilated pore of [place] Left Breast, right upper abdomen  Benign, observe.    Discussed punch excision to remove pore- would leave a scar.   Plantar wart Right Lateral Plantar Heel  Viral Wart (HPV) Counseling  Discussed viral / HPV (Human Papilloma Virus) etiology and risk of spread /infectivity to other areas of body as well as to other people.  Multiple treatments and methods may be required to clear warts and it is possible treatment may not be successful.  Treatment risks include discoloration; scarring and there is still potential for wart recurrence.  Pt declines cryotherapy today. Recommend using Curad Mediplast pads. Cut to fit wart. Cover with Elastoplast waterproof tape, or any waterproof band-aid. Change every 3 to 4 days.  Nevus Right Upper Back  Benign-appearing.  Observation.  Call clinic for new or changing lesions.  Recommend daily use of broad spectrum spf 30+ sunscreen to sun-exposed areas.     Return for TBSE 2-3 years.  I, Caren Macadam  Parcell, CMA, am acting as scribe for Brendolyn Patty, MD.  Documentation: I have reviewed the above documentation for accuracy and completeness, and I agree with the  above.  Brendolyn Patty MD

## 2022-02-10 ENCOUNTER — Ambulatory Visit: Payer: Managed Care, Other (non HMO) | Attending: Obstetrics and Gynecology

## 2022-02-10 ENCOUNTER — Other Ambulatory Visit: Payer: Self-pay

## 2022-02-10 DIAGNOSIS — O24419 Gestational diabetes mellitus in pregnancy, unspecified control: Secondary | ICD-10-CM | POA: Diagnosis not present

## 2022-02-10 DIAGNOSIS — E669 Obesity, unspecified: Secondary | ICD-10-CM | POA: Insufficient documentation

## 2022-02-10 DIAGNOSIS — O09293 Supervision of pregnancy with other poor reproductive or obstetric history, third trimester: Secondary | ICD-10-CM | POA: Insufficient documentation

## 2022-02-10 DIAGNOSIS — O99213 Obesity complicating pregnancy, third trimester: Secondary | ICD-10-CM | POA: Diagnosis not present

## 2022-02-10 DIAGNOSIS — Z3A33 33 weeks gestation of pregnancy: Secondary | ICD-10-CM | POA: Insufficient documentation

## 2022-02-10 DIAGNOSIS — O09813 Supervision of pregnancy resulting from assisted reproductive technology, third trimester: Secondary | ICD-10-CM

## 2022-02-10 DIAGNOSIS — O24410 Gestational diabetes mellitus in pregnancy, diet controlled: Secondary | ICD-10-CM

## 2022-02-10 DIAGNOSIS — O403XX0 Polyhydramnios, third trimester, not applicable or unspecified: Secondary | ICD-10-CM | POA: Insufficient documentation

## 2022-02-10 DIAGNOSIS — O402XX0 Polyhydramnios, second trimester, not applicable or unspecified: Secondary | ICD-10-CM | POA: Diagnosis not present

## 2022-02-10 DIAGNOSIS — O09523 Supervision of elderly multigravida, third trimester: Secondary | ICD-10-CM | POA: Diagnosis not present

## 2022-02-14 NOTE — L&D Delivery Note (Signed)
Date of delivery: 03/24/22 Estimated Date of Delivery: 03/25/22 EGA: [redacted]w[redacted]d Hospital Course summary: AAnnamarie Yamaguchiis a 47y.o. [id number]@ 357w6dho was admitted on 03/24/2022 for IOL for GDCurranReceived three doses of cytotec 258mPO and 47m61mV. Steadily progressed. Had two doses of IV fentanyl and then requested an epidural. Blood pressure immediately decreased after the epidural, as did the baby's heart rate and the baby began having recurrent late decels. I was called to the room. We gave multiple doses of ephedrine, fluid bolus, turned the epidural pump off. Baby's baseline increased but recurrent lates remained. I checked [name] just had an anterior lip, also did AROM and started pushing. After two pushes the lip was reduced and she made good pushing efforts.  Delivery Note At 8:38 PM a viable child was delivered via Vaginal, Spontaneous (Presentation:      ).  APGAR: 7, 9; weight  .  8lb 6oz Placenta status: Spontaneous, Intact.  Cord: 3 vessels with the following complications: None.     Anesthesia: Epidural Episiotomy: None Lacerations:  left labial, hemostatic, did not require repair Est. Blood Loss (mL): 200  Mom to postpartum.  Baby to Couplet care / Skin to Skin.  MarcMaggie Font/2024, 9:12 PM    Delivery Narrative  [name] AROM'd at 2004 for clear fluid, and had an anterior lip at that time. She began pushing then and after two contractions the anterior lip was reduced. She delivered a vigorous female infant named [name]. Apgars 7, 9. The head followed by shoulders which were delivered without difficulty, and the rest of the body. Baby was immediately placed skin to skin on mother's chest. Baby had a cord around his body. Delayed cord clamping with cord clamped and cut by CNM after one minute due to baby needing to be evaluated at the warmer. Cord blood was obtained. Placenta was delivered at 2042, primarily by maternal efforts and with some slight cord  traction. Placenta was intact, Shultz mechanism, 3 VC. Perineum inspected and found to be intact, there was a left labial laceration that was hemostatic and did not require repair. AMSTL with IV pitocin per unit protocol. EBL 200ml96mother and baby stable.

## 2022-02-16 ENCOUNTER — Other Ambulatory Visit: Payer: Self-pay

## 2022-02-16 ENCOUNTER — Encounter: Payer: Self-pay | Admitting: Licensed Practical Nurse

## 2022-02-16 ENCOUNTER — Ambulatory Visit (INDEPENDENT_AMBULATORY_CARE_PROVIDER_SITE_OTHER): Payer: Managed Care, Other (non HMO) | Admitting: Licensed Practical Nurse

## 2022-02-16 ENCOUNTER — Other Ambulatory Visit: Payer: Self-pay | Admitting: Obstetrics and Gynecology

## 2022-02-16 VITALS — BP 99/69 | HR 82 | Wt 187.7 lb

## 2022-02-16 DIAGNOSIS — O403XX0 Polyhydramnios, third trimester, not applicable or unspecified: Secondary | ICD-10-CM

## 2022-02-16 DIAGNOSIS — O24419 Gestational diabetes mellitus in pregnancy, unspecified control: Secondary | ICD-10-CM

## 2022-02-16 DIAGNOSIS — O0993 Supervision of high risk pregnancy, unspecified, third trimester: Secondary | ICD-10-CM

## 2022-02-16 DIAGNOSIS — O09523 Supervision of elderly multigravida, third trimester: Secondary | ICD-10-CM

## 2022-02-16 DIAGNOSIS — O09813 Supervision of pregnancy resulting from assisted reproductive technology, third trimester: Secondary | ICD-10-CM

## 2022-02-16 DIAGNOSIS — O24410 Gestational diabetes mellitus in pregnancy, diet controlled: Secondary | ICD-10-CM

## 2022-02-16 DIAGNOSIS — B009 Herpesviral infection, unspecified: Secondary | ICD-10-CM

## 2022-02-16 DIAGNOSIS — Z3A34 34 weeks gestation of pregnancy: Secondary | ICD-10-CM

## 2022-02-16 DIAGNOSIS — O99213 Obesity complicating pregnancy, third trimester: Secondary | ICD-10-CM

## 2022-02-16 LAB — POCT URINALYSIS DIPSTICK
Bilirubin, UA: NEGATIVE
Blood, UA: NEGATIVE
Glucose, UA: NEGATIVE
Ketones, UA: NEGATIVE
Leukocytes, UA: NEGATIVE
Nitrite, UA: NEGATIVE
Protein, UA: NEGATIVE
Spec Grav, UA: 1.01 (ref 1.010–1.025)
Urobilinogen, UA: 1 E.U./dL
pH, UA: 6 (ref 5.0–8.0)

## 2022-02-17 ENCOUNTER — Ambulatory Visit: Payer: Managed Care, Other (non HMO) | Attending: Obstetrics

## 2022-02-17 ENCOUNTER — Other Ambulatory Visit: Payer: Self-pay

## 2022-02-17 DIAGNOSIS — E669 Obesity, unspecified: Secondary | ICD-10-CM | POA: Diagnosis not present

## 2022-02-17 DIAGNOSIS — O09813 Supervision of pregnancy resulting from assisted reproductive technology, third trimester: Secondary | ICD-10-CM

## 2022-02-17 DIAGNOSIS — O402XX0 Polyhydramnios, second trimester, not applicable or unspecified: Secondary | ICD-10-CM | POA: Diagnosis not present

## 2022-02-17 DIAGNOSIS — Z3A34 34 weeks gestation of pregnancy: Secondary | ICD-10-CM | POA: Insufficient documentation

## 2022-02-17 DIAGNOSIS — O24410 Gestational diabetes mellitus in pregnancy, diet controlled: Secondary | ICD-10-CM

## 2022-02-17 DIAGNOSIS — O09523 Supervision of elderly multigravida, third trimester: Secondary | ICD-10-CM | POA: Diagnosis present

## 2022-02-17 DIAGNOSIS — O09293 Supervision of pregnancy with other poor reproductive or obstetric history, third trimester: Secondary | ICD-10-CM | POA: Insufficient documentation

## 2022-02-17 DIAGNOSIS — O99213 Obesity complicating pregnancy, third trimester: Secondary | ICD-10-CM | POA: Diagnosis not present

## 2022-02-17 DIAGNOSIS — O24419 Gestational diabetes mellitus in pregnancy, unspecified control: Secondary | ICD-10-CM | POA: Insufficient documentation

## 2022-02-17 DIAGNOSIS — O403XX0 Polyhydramnios, third trimester, not applicable or unspecified: Secondary | ICD-10-CM | POA: Diagnosis not present

## 2022-02-17 MED ORDER — METFORMIN HCL 500 MG PO TABS: 500.0000 mg | ORAL_TABLET | Freq: Two times a day (BID) | ORAL | 5 refills | Status: DC

## 2022-02-17 NOTE — Progress Notes (Signed)
Routine Prenatal Care Visit  Subjective  Cheryl Macias is a 47 y.o. G4P2011 at 66w6dbeing seen today for ongoing prenatal care.  She is currently monitored for the following issues for this high-risk pregnancy and has Cold sore; Osteoarthritis of knee; Pregnancy, supervision, high-risk; Elderly multigravida, currently pregnant; Family history of genetic disorder; Newborn product of in vitro fertilization (IVF) pregnancy; History of herpes genitalis; Polyhydramnios affecting pregnancy in third trimester; AMA (advanced maternal age) multigravida 35+, third trimester; [redacted] weeks gestation of pregnancy; [redacted] weeks gestation of pregnancy; and Diet controlled gestational diabetes mellitus (GDM) in third trimester on their problem list.  ----------------------------------------------------------------------------------- Patient reports heartburn.  Doing ok. Mood is good. Starting to get baby items has a bassinet  and carseat. Has baby shower on the 119th.  Sent blood sugar log through mychart, not able to see right now reports 4 elevated blood sugars after meals, notices it happens when she does not go for a walk after eating.  -asked about when IOL would happen, pt prefers to allow things to happen on its own, but agreeable to IOL if recommended,anticipate IOL prior to 40 wks, could be sooner based off of perinatal testing/MFM recommendations or if your blood sugars are not well controlled.  -has started Valtrex as she is does not  want to have an outbreak   Contractions: Irritability. Vag. Bleeding: None.  Movement: Present. Leaking Fluid denies.  ----------------------------------------------------------------------------------- The following portions of the patient's history were reviewed and updated as appropriate: allergies, current medications, past family history, past medical history, past social history, past surgical history and problem list. Problem list updated.  Objective  Blood pressure 99/69,  pulse 82, weight 187 lb 11.2 oz (85.1 kg), last menstrual period 06/18/2021. Pregravid weight 160 lb (72.6 kg) Total Weight Gain 27 lb 11.2 oz (12.6 kg) Urinalysis: Urine Protein    Urine Glucose    Fetal Status: Fetal Heart Rate (bpm): 135 Fundal Height: 39 cm Movement: Present     General:  Alert, oriented and cooperative. Patient is in no acute distress.  Skin: Skin is warm and dry. No rash noted.   Cardiovascular: Normal heart rate noted  Respiratory: Normal respiratory effort, no problems with respiration noted  Abdomen: Soft, gravid, appropriate for gestational age. Pain/Pressure: Present     Pelvic:  Cervical exam deferred        Extremities: Normal range of motion.  Edema: None  Mental Status: Normal mood and affect. Normal behavior. Normal judgment and thought content.   Assessment   47y.o. [id number] 363w6dy  03/25/2022, by Last Menstrual Period presenting for routine prenatal visit GDM diet controlled AMA Polyhydramnios   Plan   fourth Problems (from 08/24/21 to present)     No problems associated with this episode.        Preterm labor symptoms and general obstetric precautions including but not limited to vaginal bleeding, contractions, leaking of fluid and fetal movement were reviewed in detail with the patient. Please refer to After Visit Summary for other counseling recommendations.   Return in about 1 week (around 02/23/2022) for ROB 1-2 weeks, 36 wk labs, NST .  Has weekly  MFM fu scheduled for the next two weeks (weekly BPP or twice weekly NST until birth)   Maximino Cozzolino, CNBuffalo01/04/24  10:44 AM

## 2022-02-17 NOTE — Progress Notes (Signed)
02/17/2021 Reviewed Blood glucose log sent via mychart with Dr Marcelline Mates 7/31 elevated, 4 of those readings are with evening meals.  Will start Metformin '500mg'$  BID.  Timing of birth yet to be determined, will be based on operative report from myomectomy, glucose control and fetal testing.   TC to [place] above information.  Agreeable to starting Metformin, understands she needs to continue to check her blood sugars as she has been.   Script for Metformin '500mg'$  BID sent to [facility] on [address], [place]  02/17/22  5:19 PM

## 2022-02-22 ENCOUNTER — Other Ambulatory Visit: Payer: Self-pay

## 2022-02-22 DIAGNOSIS — O09523 Supervision of elderly multigravida, third trimester: Secondary | ICD-10-CM

## 2022-02-22 DIAGNOSIS — O09299 Supervision of pregnancy with other poor reproductive or obstetric history, unspecified trimester: Secondary | ICD-10-CM

## 2022-02-22 DIAGNOSIS — O99213 Obesity complicating pregnancy, third trimester: Secondary | ICD-10-CM

## 2022-02-22 DIAGNOSIS — O24410 Gestational diabetes mellitus in pregnancy, diet controlled: Secondary | ICD-10-CM

## 2022-02-23 ENCOUNTER — Telehealth: Payer: Self-pay

## 2022-02-23 NOTE — Telephone Encounter (Signed)
Pt called triage following up her FMLA, she dropped it off last week. If you can please let her know once it's completed and faxed.

## 2022-02-24 ENCOUNTER — Ambulatory Visit: Payer: Managed Care, Other (non HMO) | Attending: Obstetrics

## 2022-02-24 DIAGNOSIS — O09523 Supervision of elderly multigravida, third trimester: Secondary | ICD-10-CM

## 2022-02-24 DIAGNOSIS — Z3A35 35 weeks gestation of pregnancy: Secondary | ICD-10-CM

## 2022-02-24 DIAGNOSIS — O24410 Gestational diabetes mellitus in pregnancy, diet controlled: Secondary | ICD-10-CM

## 2022-02-24 DIAGNOSIS — O09813 Supervision of pregnancy resulting from assisted reproductive technology, third trimester: Secondary | ICD-10-CM

## 2022-02-24 DIAGNOSIS — O3429 Maternal care due to uterine scar from other previous surgery: Secondary | ICD-10-CM

## 2022-02-24 DIAGNOSIS — O403XX0 Polyhydramnios, third trimester, not applicable or unspecified: Secondary | ICD-10-CM

## 2022-02-24 DIAGNOSIS — Z362 Encounter for other antenatal screening follow-up: Secondary | ICD-10-CM

## 2022-02-24 DIAGNOSIS — O99213 Obesity complicating pregnancy, third trimester: Secondary | ICD-10-CM | POA: Diagnosis not present

## 2022-02-24 DIAGNOSIS — O09299 Supervision of pregnancy with other poor reproductive or obstetric history, unspecified trimester: Secondary | ICD-10-CM

## 2022-02-24 DIAGNOSIS — O09293 Supervision of pregnancy with other poor reproductive or obstetric history, third trimester: Secondary | ICD-10-CM

## 2022-02-24 DIAGNOSIS — E669 Obesity, unspecified: Secondary | ICD-10-CM

## 2022-02-24 NOTE — Telephone Encounter (Signed)
Called pt to let her know FMLA papers were complete. Will send msg as well

## 2022-02-25 ENCOUNTER — Telehealth: Payer: Self-pay

## 2022-02-25 NOTE — Telephone Encounter (Signed)
I contacted patient via phone. I advised patient that we would be adding an NST test at her appointment on 1/18

## 2022-02-28 ENCOUNTER — Telehealth: Payer: Self-pay | Admitting: Obstetrics

## 2022-02-28 NOTE — Telephone Encounter (Signed)
Left message for patient to call office to r/s appt. MyChart message sent as well.

## 2022-03-01 ENCOUNTER — Ambulatory Visit (INDEPENDENT_AMBULATORY_CARE_PROVIDER_SITE_OTHER): Payer: Managed Care, Other (non HMO) | Admitting: Advanced Practice Midwife

## 2022-03-01 ENCOUNTER — Other Ambulatory Visit (HOSPITAL_COMMUNITY)
Admission: RE | Admit: 2022-03-01 | Discharge: 2022-03-01 | Disposition: A | Discharge status: Home / Self Care | Payer: Managed Care, Other (non HMO) | Source: Ambulatory Visit | Attending: Obstetrics | Admitting: Obstetrics

## 2022-03-01 ENCOUNTER — Ambulatory Visit: Payer: Managed Care, Other (non HMO)

## 2022-03-01 ENCOUNTER — Encounter: Payer: Self-pay | Admitting: Advanced Practice Midwife

## 2022-03-01 VITALS — BP 114/70 | HR 73 | Ht 64.0 in | Wt 186.7 lb

## 2022-03-01 VITALS — BP 114/70 | HR 73 | Wt 186.7 lb

## 2022-03-01 DIAGNOSIS — Z113 Encounter for screening for infections with a predominantly sexual mode of transmission: Secondary | ICD-10-CM | POA: Diagnosis not present

## 2022-03-01 DIAGNOSIS — O24410 Gestational diabetes mellitus in pregnancy, diet controlled: Secondary | ICD-10-CM

## 2022-03-01 DIAGNOSIS — O0993 Supervision of high risk pregnancy, unspecified, third trimester: Secondary | ICD-10-CM

## 2022-03-01 DIAGNOSIS — Z3A36 36 weeks gestation of pregnancy: Secondary | ICD-10-CM

## 2022-03-01 DIAGNOSIS — O403XX0 Polyhydramnios, third trimester, not applicable or unspecified: Secondary | ICD-10-CM

## 2022-03-01 DIAGNOSIS — Z3685 Encounter for antenatal screening for Streptococcus B: Secondary | ICD-10-CM

## 2022-03-01 DIAGNOSIS — O09523 Supervision of elderly multigravida, third trimester: Secondary | ICD-10-CM

## 2022-03-01 LAB — POCT URINALYSIS DIPSTICK OB
Bilirubin, UA: NEGATIVE
Blood, UA: NEGATIVE
Glucose, UA: NEGATIVE
Ketones, UA: NEGATIVE
Leukocytes, UA: NEGATIVE
Nitrite, UA: NEGATIVE
Spec Grav, UA: 1.015 (ref 1.010–1.025)
Urobilinogen, UA: 1 E.U./dL
pH, UA: 6 (ref 5.0–8.0)

## 2022-03-01 NOTE — Progress Notes (Signed)
    NURSE VISIT NOTE  Subjective:    Patient ID: Cheryl Macias, female    DOB: 1975/08/12, 47 y.o.   MRN: [id number]  HPI  Patient is a 47 y.o. G47P2011 female who presents for fetal monitoring per order from Roberto Scales, [place].   Objective:    BP 114/70   Pulse 73   Ht '5\' 4"'$  (1.626 m)   Wt 186 lb 11.2 oz (84.7 kg)   LMP 06/18/2021 (Exact Date)   BMI 32.05 kg/m  Estimated Date of Delivery: 03/25/22  Assessment:   1. Supervision of high risk pregnancy in third trimester   2. Diet controlled gestational diabetes mellitus (GDM) in third trimester   3. Polyhydramnios affecting pregnancy in third trimester   4. AMA (advanced maternal age) multigravida 27+, third trimester   5. [redacted] weeks gestation of pregnancy      Plan:   Results reviewed and discussed with patient by  Rod Can, CNM.     Otila Kluver, LPN

## 2022-03-01 NOTE — Progress Notes (Signed)
Routine Prenatal Care Visit  Subjective  Cheryl Macias is a 47 y.o. G4P2011 at 68w4dbeing seen today for ongoing prenatal care.  She is currently monitored for the following issues for this high-risk pregnancy and has Osteoarthritis of knee; Pregnancy, supervision, high-risk; Elderly multigravida, currently pregnant; Family history of genetic disorder; Newborn product of in vitro fertilization (IVF) pregnancy; History of herpes genitalis; Polyhydramnios affecting pregnancy in third trimester; AMA (advanced maternal age) multigravida 35+, third trimester; and Diet controlled gestational diabetes mellitus (GDM) in third trimester on their problem list.  ----------------------------------------------------------------------------------- Patient reports no complaints.  Reviewed plan of care/follow up with MFM/delivery timing recommendations. Contractions: Irritability. Vag. Bleeding: None.  Movement: Present. Leaking Fluid denies.  ----------------------------------------------------------------------------------- The following portions of the patient's history were reviewed and updated as appropriate: allergies, current medications, past family history, past medical history, past social history, past surgical history and problem list. Problem list updated.  Objective  Blood pressure 114/70, pulse 73, weight 186 lb 11.2 oz (84.7 kg), last menstrual period 06/18/2021. Pregravid weight 160 lb (72.6 kg) Total Weight Gain 26 lb 11.2 oz (12.1 kg) Urinalysis: Urine Protein Trace  Urine Glucose Negative  Fetal Status: Fetal Heart Rate (bpm): 145 Fundal Height: 38 cm Movement: Present     NST: reactive 20 minute tracing, 145 bpm, moderate variability, +accelerations, -decelerations  BG log: all normal with the exception of 3 elevated after dinner in the 130s. She is taking Metformin 2 times daily.  General:  Alert, oriented and cooperative. Patient is in no acute distress.  Skin: Skin is warm and dry. No  rash noted.   Cardiovascular: Normal heart rate noted  Respiratory: Normal respiratory effort, no problems with respiration noted  Abdomen: Soft, gravid, appropriate for gestational age. Pain/Pressure: Present (Pelvic pressure)     Pelvic:   GBS/aptima collected         Extremities: Normal range of motion.     Mental Status: Normal mood and affect. Normal behavior. Normal judgment and thought content.   Assessment   47y.o. [id number] 353w4dy  03/25/2022, by Last Menstrual Period presenting for routine prenatal visit  Plan     Preterm labor symptoms and general obstetric precautions including but not limited to vaginal bleeding, contractions, leaking of fluid and fetal movement were reviewed in detail with the patient. Please refer to After Visit Summary for other counseling recommendations.   Return in about 1 week (around 03/08/2022) for mfm bpp and rob here.  JaRod CanCNM 03/01/2022 9:28 AM

## 2022-03-01 NOTE — Progress Notes (Signed)
ROB [redacted]w[redacted]d NST, GBS/aptima today. Schedule w/MFM for u/s next week 03/10/22.

## 2022-03-01 NOTE — Patient Instructions (Signed)

## 2022-03-02 ENCOUNTER — Encounter: Payer: Self-pay | Admitting: Advanced Practice Midwife

## 2022-03-02 LAB — CERVICOVAGINAL ANCILLARY ONLY
Chlamydia: NEGATIVE
Comment: NEGATIVE
Comment: NORMAL
Neisseria Gonorrhea: NEGATIVE

## 2022-03-03 ENCOUNTER — Encounter: Payer: Managed Care, Other (non HMO) | Admitting: Obstetrics

## 2022-03-03 ENCOUNTER — Other Ambulatory Visit: Payer: Managed Care, Other (non HMO)

## 2022-03-03 DIAGNOSIS — O0993 Supervision of high risk pregnancy, unspecified, third trimester: Secondary | ICD-10-CM

## 2022-03-03 LAB — STREP GP B NAA: Strep Gp B NAA: NEGATIVE

## 2022-03-03 NOTE — Telephone Encounter (Signed)
Patient is scheduled for 1/26 with Deneise Lever for Cheryl Macias

## 2022-03-08 ENCOUNTER — Other Ambulatory Visit: Payer: Self-pay

## 2022-03-08 DIAGNOSIS — Z8279 Family history of other congenital malformations, deformations and chromosomal abnormalities: Secondary | ICD-10-CM

## 2022-03-08 DIAGNOSIS — O99213 Obesity complicating pregnancy, third trimester: Secondary | ICD-10-CM

## 2022-03-08 DIAGNOSIS — O09523 Supervision of elderly multigravida, third trimester: Secondary | ICD-10-CM

## 2022-03-08 DIAGNOSIS — Z9889 Other specified postprocedural states: Secondary | ICD-10-CM

## 2022-03-08 DIAGNOSIS — O24410 Gestational diabetes mellitus in pregnancy, diet controlled: Secondary | ICD-10-CM

## 2022-03-08 DIAGNOSIS — O403XX0 Polyhydramnios, third trimester, not applicable or unspecified: Secondary | ICD-10-CM

## 2022-03-10 ENCOUNTER — Ambulatory Visit: Payer: Managed Care, Other (non HMO) | Attending: Obstetrics

## 2022-03-10 ENCOUNTER — Other Ambulatory Visit: Payer: Self-pay

## 2022-03-10 DIAGNOSIS — O24410 Gestational diabetes mellitus in pregnancy, diet controlled: Secondary | ICD-10-CM | POA: Insufficient documentation

## 2022-03-10 DIAGNOSIS — O09293 Supervision of pregnancy with other poor reproductive or obstetric history, third trimester: Secondary | ICD-10-CM | POA: Insufficient documentation

## 2022-03-10 DIAGNOSIS — E669 Obesity, unspecified: Secondary | ICD-10-CM | POA: Diagnosis not present

## 2022-03-10 DIAGNOSIS — O403XX0 Polyhydramnios, third trimester, not applicable or unspecified: Secondary | ICD-10-CM | POA: Insufficient documentation

## 2022-03-10 DIAGNOSIS — Z362 Encounter for other antenatal screening follow-up: Secondary | ICD-10-CM

## 2022-03-10 DIAGNOSIS — O09523 Supervision of elderly multigravida, third trimester: Secondary | ICD-10-CM | POA: Insufficient documentation

## 2022-03-10 DIAGNOSIS — Z3A37 37 weeks gestation of pregnancy: Secondary | ICD-10-CM | POA: Diagnosis not present

## 2022-03-10 DIAGNOSIS — O99213 Obesity complicating pregnancy, third trimester: Secondary | ICD-10-CM | POA: Diagnosis not present

## 2022-03-10 DIAGNOSIS — O09813 Supervision of pregnancy resulting from assisted reproductive technology, third trimester: Secondary | ICD-10-CM | POA: Diagnosis not present

## 2022-03-10 DIAGNOSIS — Z9889 Other specified postprocedural states: Secondary | ICD-10-CM

## 2022-03-10 DIAGNOSIS — Z8279 Family history of other congenital malformations, deformations and chromosomal abnormalities: Secondary | ICD-10-CM | POA: Insufficient documentation

## 2022-03-10 DIAGNOSIS — O3429 Maternal care due to uterine scar from other previous surgery: Secondary | ICD-10-CM

## 2022-03-11 ENCOUNTER — Other Ambulatory Visit (HOSPITAL_COMMUNITY)
Admission: RE | Admit: 2022-03-11 | Discharge: 2022-03-11 | Disposition: A | Discharge status: Home / Self Care | Payer: Managed Care, Other (non HMO) | Source: Ambulatory Visit | Attending: Certified Nurse Midwife | Admitting: Certified Nurse Midwife

## 2022-03-11 ENCOUNTER — Ambulatory Visit (INDEPENDENT_AMBULATORY_CARE_PROVIDER_SITE_OTHER): Payer: Managed Care, Other (non HMO) | Admitting: Certified Nurse Midwife

## 2022-03-11 VITALS — BP 112/72 | HR 76 | Wt 190.0 lb

## 2022-03-11 DIAGNOSIS — N898 Other specified noninflammatory disorders of vagina: Secondary | ICD-10-CM

## 2022-03-11 DIAGNOSIS — Z3A38 38 weeks gestation of pregnancy: Secondary | ICD-10-CM

## 2022-03-11 DIAGNOSIS — O99891 Other specified diseases and conditions complicating pregnancy: Secondary | ICD-10-CM | POA: Insufficient documentation

## 2022-03-11 DIAGNOSIS — Z3483 Encounter for supervision of other normal pregnancy, third trimester: Secondary | ICD-10-CM

## 2022-03-11 LAB — POCT URINALYSIS DIPSTICK OB
Bilirubin, UA: NEGATIVE
Blood, UA: NEGATIVE
Glucose, UA: NEGATIVE
Ketones, UA: NEGATIVE
Leukocytes, UA: NEGATIVE
Nitrite, UA: NEGATIVE
POC,PROTEIN,UA: NEGATIVE
Spec Grav, UA: 1.01 (ref 1.010–1.025)
Urobilinogen, UA: 0.2 E.U./dL
pH, UA: 6 (ref 5.0–8.0)

## 2022-03-11 NOTE — Progress Notes (Signed)
ROB doing well, had MFM appointment yesterday BPP 8/8 . Vertex position. BS fasting's 80s-90s , 2 elevated 2 hr pp 128/156. She has follow up appointment with MFM on Tuesday. She has concerns for HSV out break , state her bottom feels sensitive. Close look , no active lesions seen some mild redness, possible yeast. Swab collected. SVE closed high. Discussed induction 39th week due to diabetes , AMA.   She verblaizes and agrees to plan . Will work on scheduling.   Philip Aspen, CNM

## 2022-03-14 ENCOUNTER — Other Ambulatory Visit: Payer: Self-pay | Admitting: Obstetrics

## 2022-03-14 DIAGNOSIS — O09523 Supervision of elderly multigravida, third trimester: Secondary | ICD-10-CM

## 2022-03-14 DIAGNOSIS — O99213 Obesity complicating pregnancy, third trimester: Secondary | ICD-10-CM

## 2022-03-14 DIAGNOSIS — O24410 Gestational diabetes mellitus in pregnancy, diet controlled: Secondary | ICD-10-CM

## 2022-03-15 ENCOUNTER — Other Ambulatory Visit: Payer: Self-pay

## 2022-03-15 ENCOUNTER — Encounter: Payer: Self-pay | Admitting: Certified Nurse Midwife

## 2022-03-15 ENCOUNTER — Ambulatory Visit: Payer: Managed Care, Other (non HMO) | Attending: Obstetrics

## 2022-03-15 DIAGNOSIS — O09523 Supervision of elderly multigravida, third trimester: Secondary | ICD-10-CM | POA: Diagnosis not present

## 2022-03-15 DIAGNOSIS — O403XX0 Polyhydramnios, third trimester, not applicable or unspecified: Secondary | ICD-10-CM | POA: Diagnosis not present

## 2022-03-15 DIAGNOSIS — Z9889 Other specified postprocedural states: Secondary | ICD-10-CM

## 2022-03-15 DIAGNOSIS — E669 Obesity, unspecified: Secondary | ICD-10-CM

## 2022-03-15 DIAGNOSIS — O09293 Supervision of pregnancy with other poor reproductive or obstetric history, third trimester: Secondary | ICD-10-CM | POA: Insufficient documentation

## 2022-03-15 DIAGNOSIS — Z8489 Family history of other specified conditions: Secondary | ICD-10-CM

## 2022-03-15 DIAGNOSIS — O24419 Gestational diabetes mellitus in pregnancy, unspecified control: Secondary | ICD-10-CM | POA: Diagnosis not present

## 2022-03-15 DIAGNOSIS — Z3A38 38 weeks gestation of pregnancy: Secondary | ICD-10-CM | POA: Insufficient documentation

## 2022-03-15 DIAGNOSIS — Z8279 Family history of other congenital malformations, deformations and chromosomal abnormalities: Secondary | ICD-10-CM

## 2022-03-15 DIAGNOSIS — Z362 Encounter for other antenatal screening follow-up: Secondary | ICD-10-CM | POA: Diagnosis not present

## 2022-03-15 DIAGNOSIS — O09813 Supervision of pregnancy resulting from assisted reproductive technology, third trimester: Secondary | ICD-10-CM

## 2022-03-15 DIAGNOSIS — O24410 Gestational diabetes mellitus in pregnancy, diet controlled: Secondary | ICD-10-CM

## 2022-03-15 DIAGNOSIS — O99213 Obesity complicating pregnancy, third trimester: Secondary | ICD-10-CM | POA: Diagnosis present

## 2022-03-15 DIAGNOSIS — O3429 Maternal care due to uterine scar from other previous surgery: Secondary | ICD-10-CM

## 2022-03-15 LAB — CERVICOVAGINAL ANCILLARY ONLY
Bacterial Vaginitis (gardnerella): NEGATIVE
Candida Glabrata: NEGATIVE
Candida Vaginitis: POSITIVE — AB
Comment: NEGATIVE
Comment: NEGATIVE
Comment: NEGATIVE

## 2022-03-16 ENCOUNTER — Encounter: Payer: Self-pay | Admitting: Certified Nurse Midwife

## 2022-03-16 ENCOUNTER — Other Ambulatory Visit: Payer: Self-pay | Admitting: Certified Nurse Midwife

## 2022-03-16 MED ORDER — MONISTAT 7 COMBO PACK APP 100 & 2 MG-% (9GM) VA KIT: 1.0000 | PACK | Freq: Every day | VAGINAL | 0 refills | Status: AC

## 2022-03-18 ENCOUNTER — Encounter: Payer: Self-pay | Admitting: Obstetrics and Gynecology

## 2022-03-18 ENCOUNTER — Ambulatory Visit (INDEPENDENT_AMBULATORY_CARE_PROVIDER_SITE_OTHER): Payer: Managed Care, Other (non HMO) | Admitting: Obstetrics and Gynecology

## 2022-03-18 VITALS — BP 104/68 | HR 73 | Wt 190.9 lb

## 2022-03-18 DIAGNOSIS — O09529 Supervision of elderly multigravida, unspecified trimester: Secondary | ICD-10-CM

## 2022-03-18 DIAGNOSIS — O0993 Supervision of high risk pregnancy, unspecified, third trimester: Secondary | ICD-10-CM

## 2022-03-18 DIAGNOSIS — O24419 Gestational diabetes mellitus in pregnancy, unspecified control: Secondary | ICD-10-CM

## 2022-03-18 DIAGNOSIS — Z3A39 39 weeks gestation of pregnancy: Secondary | ICD-10-CM

## 2022-03-18 LAB — POCT URINALYSIS DIPSTICK OB
Bilirubin, UA: NEGATIVE
Blood, UA: NEGATIVE
Glucose, UA: NEGATIVE
Ketones, UA: NEGATIVE
Leukocytes, UA: NEGATIVE
Nitrite, UA: NEGATIVE
POC,PROTEIN,UA: NEGATIVE
Urobilinogen, UA: 0.2 E.U./dL
pH, UA: 5 (ref 5.0–8.0)

## 2022-03-18 NOTE — Patient Instructions (Signed)
Signs and Symptoms of Labor Labor is the body's natural process of moving the baby and the placenta out of the uterus. The process of labor usually starts when the baby is full-term, between 39 and 41 weeks of pregnancy. Signs and symptoms that you are close to going into labor As your body prepares for labor and the birth of your baby, you may notice the following symptoms in the weeks and days before true labor starts: Passing a small amount of thick, bloody mucus from your vagina. This is called normal bloody show or losing your mucus plug. This may happen more than a week before labor begins, or right before labor begins, as the opening of the cervix starts to widen (dilate). For some women, the entire mucus plug passes at once. For others, pieces of the mucus plug may gradually pass over several days. Your baby moving (dropping) lower in your pelvis to get into position for birth (lightening). When this happens, you may feel more pressure on your bladder and pelvic bone and less pressure on your ribs. This may make it easier to breathe. It may also cause you to need to urinate more often and have problems with bowel movements. Having "practice contractions," also called Braxton Hicks contractions or false labor. These occur at irregular (unevenly spaced) intervals that are more than 10 minutes apart. False labor contractions are common after exercise or sexual activity. They will stop if you change position, rest, or drink fluids. These contractions are usually mild and do not get stronger over time. They may feel like: A backache or back pain. Mild cramps, similar to menstrual cramps. Tightening or pressure in your abdomen. Other early symptoms include: Nausea or loss of appetite. Diarrhea. Having a sudden burst of energy, or feeling very tired. Mood changes. Having trouble sleeping. Signs and symptoms that labor has begun Signs that you are in labor may include: Having contractions that come  at regular (evenly spaced) intervals and increase in intensity. This may feel like more intense tightening or pressure in your abdomen that moves to your back. Contractions may also feel like rhythmic pain in your upper thighs or back that comes and goes at regular intervals. If you are delivering for the first time, this change in intensity of contractions often occurs at a more gradual pace. If you have given birth before, you may notice a more rapid progression of contraction changes. Feeling pressure in the vaginal area. Your water breaking (rupture of membranes). This is when the sac of fluid that surrounds your baby breaks. Fluid leaking from your vagina may be clear or blood-tinged. Labor usually starts within 24 hours of your water breaking, but it may take longer to begin. Some people may feel a sudden gush of fluid; others may notice repeatedly damp underwear. Follow these instructions at home:  When labor starts, or if your water breaks, call your health care provider or nurse care line. Based on your situation, they will determine when you should go in for an exam. During early labor, you may be able to rest and manage symptoms at home. Some strategies to try at home include: Breathing and relaxation techniques. Taking a warm bath or shower. Listening to music. Using a heating pad on the lower back for pain. If directed, apply heat to the area as often as told by your health care provider. Use the heat source that your health care provider recommends, such as a moist heat pack or a heating pad. Place a   towel between your skin and the heat source. Leave the heat on for 20-30 minutes. Remove the heat if your skin turns bright red. This is especially important if you are unable to feel pain, heat, or cold. You have a greater risk of getting burned. Contact a health care provider if: Your labor has started. Your water breaks. You have nausea, vomiting, or diarrhea. Get help right away  if: You have painful, regular contractions that are 5 minutes apart or less. Labor starts before you are [redacted] weeks along in your pregnancy. You have a fever. You have bright red blood coming from your vagina. You do not feel your baby moving. You have a severe headache with or without vision problems. You have chest pain or shortness of breath. These symptoms may represent a serious problem that is an emergency. Do not wait to see if the symptoms will go away. Get medical help right away. Call your local emergency services (911 in the [place] Do not drive yourself to the hospital. Summary Labor is your body's natural process of moving your baby and the placenta out of your uterus. The process of labor usually starts when your baby is full-term, between 39 and 40 weeks of pregnancy. When labor starts, or if your water breaks, call your health care provider or nurse care line. Based on your situation, they will determine when you should go in for an exam. This information is not intended to replace advice given to you by your health care provider. Make sure you discuss any questions you have with your health care provider. Document Revised: 06/16/2020 Document Reviewed: 06/16/2020 Elsevier Patient Education  2023 [employer]  

## 2022-03-18 NOTE — Progress Notes (Signed)
ROB [redacted]w[redacted]d She is doing well. She has been having some Braxton Hick's contractions. She has good fetal movement. She has no new concerns today.

## 2022-03-18 NOTE — Progress Notes (Signed)
ROB: Patient is a 47 y.o. [id number] female at [redacted]w[redacted]d  Complains of yeast infection, treating with monistat, on Day 3.  Is noting improvement in her symptoms.  Had  normal BPP earlier this week, fetal size noted to be 91%ile (8 lb 10 oz). AFI now normal. Notes compliance with Metformin and Valtrex.  . Is scheduled for IOL next Thursday. Declines pelvic exam until next week. For NST next week.

## 2022-03-20 ENCOUNTER — Encounter: Payer: Self-pay | Admitting: Obstetrics and Gynecology

## 2022-03-22 ENCOUNTER — Ambulatory Visit: Payer: Managed Care, Other (non HMO)

## 2022-03-22 ENCOUNTER — Other Ambulatory Visit (HOSPITAL_COMMUNITY)
Admission: RE | Admit: 2022-03-22 | Discharge: 2022-03-22 | Disposition: A | Discharge status: Home / Self Care | Payer: Managed Care, Other (non HMO) | Source: Ambulatory Visit | Attending: Obstetrics and Gynecology | Admitting: Obstetrics and Gynecology

## 2022-03-22 ENCOUNTER — Other Ambulatory Visit: Payer: Managed Care, Other (non HMO)

## 2022-03-22 ENCOUNTER — Ambulatory Visit (INDEPENDENT_AMBULATORY_CARE_PROVIDER_SITE_OTHER): Payer: Managed Care, Other (non HMO) | Admitting: Obstetrics and Gynecology

## 2022-03-22 VITALS — BP 124/80 | HR 70 | Wt 189.1 lb

## 2022-03-22 VITALS — BP 124/80 | HR 70 | Ht 64.0 in | Wt 189.1 lb

## 2022-03-22 DIAGNOSIS — Z113 Encounter for screening for infections with a predominantly sexual mode of transmission: Secondary | ICD-10-CM | POA: Insufficient documentation

## 2022-03-22 DIAGNOSIS — Z3A39 39 weeks gestation of pregnancy: Secondary | ICD-10-CM | POA: Insufficient documentation

## 2022-03-22 DIAGNOSIS — O403XX0 Polyhydramnios, third trimester, not applicable or unspecified: Secondary | ICD-10-CM

## 2022-03-22 DIAGNOSIS — O09523 Supervision of elderly multigravida, third trimester: Secondary | ICD-10-CM

## 2022-03-22 DIAGNOSIS — Z3490 Encounter for supervision of normal pregnancy, unspecified, unspecified trimester: Secondary | ICD-10-CM

## 2022-03-22 DIAGNOSIS — O24410 Gestational diabetes mellitus in pregnancy, diet controlled: Secondary | ICD-10-CM

## 2022-03-22 DIAGNOSIS — O24415 Gestational diabetes mellitus in pregnancy, controlled by oral hypoglycemic drugs: Secondary | ICD-10-CM | POA: Insufficient documentation

## 2022-03-22 DIAGNOSIS — O0993 Supervision of high risk pregnancy, unspecified, third trimester: Secondary | ICD-10-CM

## 2022-03-22 DIAGNOSIS — O3429 Maternal care due to uterine scar from other previous surgery: Secondary | ICD-10-CM | POA: Insufficient documentation

## 2022-03-22 LAB — POCT URINALYSIS DIPSTICK OB
Appearance: NORMAL
Bilirubin, UA: NEGATIVE
Blood, UA: NEGATIVE
Glucose, UA: NEGATIVE
Ketones, UA: NEGATIVE
Leukocytes, UA: NEGATIVE
Nitrite, UA: NEGATIVE
Odor: NORMAL
POC,PROTEIN,UA: NEGATIVE
Spec Grav, UA: 1.015 (ref 1.010–1.025)
Urobilinogen, UA: 0.2 E.U./dL
pH, UA: 6 (ref 5.0–8.0)

## 2022-03-22 NOTE — Progress Notes (Addendum)
ROB: Patient is a 47 y.o. [id number] at 84w4dwho presents for routine care.  Patient scheduled for IOL in 2 days.  Attempted to answer all questions about IOL.  Is requesting full STD panel today (including HSV and HIV). Notes some concern about having a yeast infection that came on suddenly. Desires to be checked for "everything".  I discussed that yeast infections can be common for pregnancy, however patient still strongly desiring STD screening. Advised that results may or may not return prior to her scheduled IOL date.  Patient notes understanding. NST done today.  Is considering ParaGard IUD for contraception.     AErrica Dutil1March 11, 1977348w4dFetus A Non-Stress Test Interpretation for 03/22/22  Indication: Gestational Diabetes medication controlled and Advanced Maternal Age >40 years  Fetal Heart Rate A Mode: External Baseline Rate (A): 135 bpm Variability: Moderate Accelerations: 15 x 15 Decelerations: None Multiple birth?: No  Uterine Activity Mode: Toco Contraction Frequency (min): none (uterine irritability)  Interpretation (Fetal Testing) Nonstress Test Interpretation: Reactive Overall Impression: Reassuring for gestational age

## 2022-03-22 NOTE — Patient Instructions (Signed)

## 2022-03-22 NOTE — Progress Notes (Signed)
    NURSE VISIT NOTE  Subjective:    Patient ID: Cheryl Macias, female    DOB: [birth year], 47 y.o.   MRN: [id number]  HPI  Patient is a 47 y.o. G83P2011 female who presents for fetal monitoring per order from Rubie Maid, MD.   Objective:    BP 124/80   Pulse 70   Ht '5\' 4"'$  (1.626 m)   Wt 189 lb 1.6 oz (85.8 kg)   LMP 06/18/2021 (Exact Date)   BMI 32.46 kg/m  Estimated Date of Delivery: 03/25/22  Assessment:   1. Supervision of high risk pregnancy in third trimester   2. Diet controlled gestational diabetes mellitus (GDM) in third trimester   3. Polyhydramnios affecting pregnancy in third trimester   4. AMA (advanced maternal age) multigravida 11+, third trimester   5. [redacted] weeks gestation of pregnancy      Plan:   Results reviewed and discussed with patient by  Rubie Maid, MD.     Otila Kluver, LPN

## 2022-03-22 NOTE — Progress Notes (Addendum)
ROB [redacted]w[redacted]d NST today. Patient reports good fetal movement. Denies leaking of fluid/vaginal bleeding. Reports braxton hicks contracitons. Requesting full STD panel today. IOL 03/24/22. Patient reports she is considering Paragard for postpartum contraception.

## 2022-03-23 ENCOUNTER — Encounter: Payer: Self-pay | Admitting: Obstetrics and Gynecology

## 2022-03-23 ENCOUNTER — Ambulatory Visit: Payer: Managed Care, Other (non HMO) | Admitting: Dermatology

## 2022-03-23 ENCOUNTER — Encounter: Payer: Self-pay | Admitting: Dermatology

## 2022-03-23 VITALS — BP 107/68 | HR 84

## 2022-03-23 DIAGNOSIS — L820 Inflamed seborrheic keratosis: Secondary | ICD-10-CM

## 2022-03-23 DIAGNOSIS — L821 Other seborrheic keratosis: Secondary | ICD-10-CM | POA: Diagnosis not present

## 2022-03-23 NOTE — Progress Notes (Signed)
   Follow-Up Visit   Subjective  Cheryl Macias is a 47 y.o. female who presents for the following: Seborrheic Keratosis (Area frozen on left shoulder has not resolved. ).  The patient has spots, moles and lesions to be evaluated, some may be new or changing and the patient has concerns that these could be cancer.   The following portions of the chart were reviewed this encounter and updated as appropriate:      Review of Systems: No other skin or systemic complaints except as noted in HPI or Assessment and Plan.   Objective  Well appearing patient in no apparent distress; mood and affect are within normal limits.  A focused examination was performed including left shoulder. Relevant physical exam findings are noted in the Assessment and Plan.  Left Shoulder - Anterior x1 Erythematous keratotic or waxy stuck-on papule or plaque, residual   Assessment & Plan  Inflamed seborrheic keratosis Left Shoulder - Anterior x1  Symptomatic, irritating, patient would like treated.  Destruction of lesion - Left Shoulder - Anterior x1  Destruction method: cryotherapy   Informed consent: discussed and consent obtained   Lesion destroyed using liquid nitrogen: Yes   Region frozen until ice ball extended beyond lesion: Yes   Outcome: patient tolerated procedure well with no complications   Post-procedure details: wound care instructions given   Additional details:  Prior to procedure, discussed risks of blister formation, small wound, skin dyspigmentation, or rare scar following cryotherapy. Recommend Vaseline ointment to treated areas while healing.   Seborrheic Keratoses - Stuck-on, waxy, tan-brown papules - Benign-appearing - Discussed benign etiology and prognosis. - Observe - Call for any changes  Return if symptoms worsen or fail to improve.  I, Emelia Salisbury, CMA, am acting as scribe for Brendolyn Patty, MD.  Documentation: I have reviewed the above documentation for accuracy and  completeness, and I agree with the above.  Brendolyn Patty MD

## 2022-03-23 NOTE — Patient Instructions (Addendum)
Cryotherapy Aftercare  Wash gently with soap and water everyday.   Apply Vaseline and Band-Aid daily until healed.     Due to recent changes in healthcare laws, you may see results of your pathology and/or laboratory studies on MyChart before the doctors have had a chance to review them. We understand that in some cases there may be results that are confusing or concerning to you. Please understand that not all results are received at the same time and often the doctors may need to interpret multiple results in order to provide you with the best plan of care or course of treatment. Therefore, we ask that you please give us 2 business days to thoroughly review all your results before contacting the office for clarification. Should we see a critical lab result, you will be contacted sooner.   If You Need Anything After Your Visit  If you have any questions or concerns for your doctor, please call our main line at [phone] and press option 4 to reach your doctor's medical assistant. If no one answers, please leave a voicemail as directed and we will return your call as soon as possible. Messages left after 4 pm will be answered the following business day.   You may also send us a message via MyChart. We typically respond to MyChart messages within 1-2 business days.  For prescription refills, please ask your pharmacy to contact our office. Our fax number is [phone].  If you have an urgent issue when the clinic is closed that cannot wait until the next business day, you can page your doctor at the number below.    Please note that while we do our best to be available for urgent issues outside of office hours, we are not available 24/7.   If you have an urgent issue and are unable to reach us, you may choose to seek medical care at your doctor's office, retail clinic, urgent care center, or emergency room.  If you have a medical emergency, please immediately call 911 or go to the  emergency department.  Pager Numbers  - Dr. Kowalski: [phone]  - Dr. Moye: [phone]  - Dr. Stewart: [phone]  In the event of inclement weather, please call our main line at [phone] for an update on the status of any delays or closures.  Dermatology Medication Tips: Please keep the boxes that topical medications come in in order to help keep track of the instructions about where and how to use these. Pharmacies typically print the medication instructions only on the boxes and not directly on the medication tubes.   If your medication is too expensive, please contact our office at [phone] option 4 or send us a message through MyChart.   We are unable to tell what your co-pay for medications will be in advance as this is different depending on your insurance coverage. However, we may be able to find a substitute medication at lower cost or fill out paperwork to get insurance to cover a needed medication.   If a prior authorization is required to get your medication covered by your insurance company, please allow us 1-2 business days to complete this process.  Drug prices often vary depending on where the prescription is filled and some pharmacies may offer cheaper prices.  The website www.goodrx.com contains coupons for medications through different pharmacies. The prices here do not account for what the cost may be with help from insurance (it may be cheaper with your insurance), but the website can   give you the price if you did not use any insurance.  - You can print the associated coupon and take it with your prescription to the pharmacy.  - You may also stop by our office during regular business hours and pick up a GoodRx coupon card.  - If you need your prescription sent electronically to a different pharmacy, notify our office through [facility] MyChart or by phone at [phone] option 4.     Si Usted Necesita Algo Despus de Su Visita  Tambin puede  enviarnos un mensaje a travs de MyChart. Por lo general respondemos a los mensajes de MyChart en el transcurso de 1 a 2 das hbiles.  Para renovar recetas, por favor pida a su farmacia que se ponga en contacto con nuestra oficina. Nuestro nmero de fax es el [phone].  Si tiene un asunto urgente cuando la clnica est cerrada y que no puede esperar hasta el siguiente da hbil, puede llamar/localizar a su doctor(a) al nmero que aparece a continuacin.   Por favor, tenga en cuenta que aunque hacemos todo lo posible para estar disponibles para asuntos urgentes fuera del horario de oficina, no estamos disponibles las 24 horas del da, los 7 das de la semana.   Si tiene un problema urgente y no puede comunicarse con nosotros, puede optar por buscar atencin mdica  en el consultorio de su doctor(a), en una clnica privada, en un centro de atencin urgente o en una sala de emergencias.  Si tiene una emergencia mdica, por favor llame inmediatamente al 911 o vaya a la sala de emergencias.  Nmeros de bper  - Dr. Kowalski: [phone]  - Dra. Moye: [phone]  - Dra. Stewart: [phone]  En caso de inclemencias del tiempo, por favor llame a nuestra lnea principal al [phone] para una actualizacin sobre el estado de cualquier retraso o cierre.  Consejos para la medicacin en dermatologa: Por favor, guarde las cajas en las que vienen los medicamentos de uso tpico para ayudarle a seguir las instrucciones sobre dnde y cmo usarlos. Las farmacias generalmente imprimen las instrucciones del medicamento slo en las cajas y no directamente en los tubos del medicamento.   Si su medicamento es muy caro, por favor, pngase en contacto con nuestra oficina llamando al [phone] y presione la opcin 4 o envenos un mensaje a travs de MyChart.   No podemos decirle cul ser su copago por los medicamentos por adelantado ya que esto es diferente dependiendo de la cobertura de su seguro.  Sin embargo, es posible que podamos encontrar un medicamento sustituto a menor costo o llenar un formulario para que el seguro cubra el medicamento que se considera necesario.   Si se requiere una autorizacin previa para que su compaa de seguros cubra su medicamento, por favor permtanos de 1 a 2 das hbiles para completar este proceso.  Los precios de los medicamentos varan con frecuencia dependiendo del lugar de dnde se surte la receta y alguna farmacias pueden ofrecer precios ms baratos.  El sitio web www.goodrx.com tiene cupones para medicamentos de diferentes farmacias. Los precios aqu no tienen en cuenta lo que podra costar con la ayuda del seguro (puede ser ms barato con su seguro), pero el sitio web puede darle el precio si no utiliz ningn seguro.  - Puede imprimir el cupn correspondiente y llevarlo con su receta a la farmacia.  - Tambin puede pasar por nuestra oficina durante el horario de atencin regular y recoger una tarjeta de cupones de GoodRx.  -   Si necesita que su receta se enve electrnicamente a una farmacia diferente, informe a nuestra oficina a travs de MyChart de [facility] o por telfono llamando al [phone] y presione la opcin 4.  

## 2022-03-24 ENCOUNTER — Inpatient Hospital Stay
Admission: EM | Admit: 2022-03-24 | Discharge: 2022-03-26 | DRG: 806 | Disposition: A | Discharge status: Home / Self Care | Payer: Managed Care, Other (non HMO) | Attending: Family | Admitting: Family

## 2022-03-24 ENCOUNTER — Encounter: Payer: Self-pay | Admitting: Certified Nurse Midwife

## 2022-03-24 ENCOUNTER — Inpatient Hospital Stay: Payer: Managed Care, Other (non HMO) | Admitting: General Practice

## 2022-03-24 ENCOUNTER — Other Ambulatory Visit: Payer: Self-pay

## 2022-03-24 DIAGNOSIS — O9832 Other infections with a predominantly sexual mode of transmission complicating childbirth: Secondary | ICD-10-CM | POA: Diagnosis present

## 2022-03-24 DIAGNOSIS — O09293 Supervision of pregnancy with other poor reproductive or obstetric history, third trimester: Secondary | ICD-10-CM | POA: Diagnosis not present

## 2022-03-24 DIAGNOSIS — Z3A39 39 weeks gestation of pregnancy: Secondary | ICD-10-CM

## 2022-03-24 DIAGNOSIS — O99214 Obesity complicating childbirth: Secondary | ICD-10-CM | POA: Diagnosis present

## 2022-03-24 DIAGNOSIS — Z7982 Long term (current) use of aspirin: Secondary | ICD-10-CM | POA: Diagnosis not present

## 2022-03-24 DIAGNOSIS — O09523 Supervision of elderly multigravida, third trimester: Secondary | ICD-10-CM | POA: Diagnosis not present

## 2022-03-24 DIAGNOSIS — O9902 Anemia complicating childbirth: Secondary | ICD-10-CM | POA: Diagnosis present

## 2022-03-24 DIAGNOSIS — A6000 Herpesviral infection of urogenital system, unspecified: Secondary | ICD-10-CM | POA: Diagnosis present

## 2022-03-24 DIAGNOSIS — O24415 Gestational diabetes mellitus in pregnancy, controlled by oral hypoglycemic drugs: Secondary | ICD-10-CM | POA: Diagnosis not present

## 2022-03-24 DIAGNOSIS — Z3490 Encounter for supervision of normal pregnancy, unspecified, unspecified trimester: Secondary | ICD-10-CM

## 2022-03-24 DIAGNOSIS — O24425 Gestational diabetes mellitus in childbirth, controlled by oral hypoglycemic drugs: Principal | ICD-10-CM | POA: Diagnosis present

## 2022-03-24 DIAGNOSIS — O09813 Supervision of pregnancy resulting from assisted reproductive technology, third trimester: Secondary | ICD-10-CM | POA: Diagnosis not present

## 2022-03-24 LAB — CBC
HCT: 35.1 % — ABNORMAL LOW (ref 36.0–46.0)
Hemoglobin: 11.7 g/dL — ABNORMAL LOW (ref 12.0–15.0)
MCH: 29.1 pg (ref 26.0–34.0)
MCHC: 33.3 g/dL (ref 30.0–36.0)
MCV: 87.3 fL (ref 80.0–100.0)
Platelets: 191 10*3/uL (ref 150–400)
RBC: 4.02 MIL/uL (ref 3.87–5.11)
RDW: 13.2 % (ref 11.5–15.5)
WBC: 5.8 10*3/uL (ref 4.0–10.5)
nRBC: 0 % (ref 0.0–0.2)

## 2022-03-24 LAB — TYPE AND SCREEN
ABO/RH(D): O POS
Antibody Screen: NEGATIVE

## 2022-03-24 LAB — GLUCOSE, CAPILLARY
Glucose-Capillary: 77 mg/dL (ref 70–99)
Glucose-Capillary: 87 mg/dL (ref 70–99)
Glucose-Capillary: 97 mg/dL (ref 70–99)
Glucose-Capillary: 76 mg/dL (ref 70–99)

## 2022-03-24 LAB — ABO/RH: ABO/RH(D): O POS

## 2022-03-24 MED ORDER — PHENYLEPHRINE 80 MCG/ML (10ML) SYRINGE FOR IV PUSH (FOR BLOOD PRESSURE SUPPORT)
80.0000 ug | PREFILLED_SYRINGE | INTRAVENOUS | Status: DC | PRN
  Administered 2022-03-24: 80 ug via INTRAVENOUS

## 2022-03-24 MED ORDER — DIBUCAINE (PERIANAL) 1 % EX OINT
1.0000 | TOPICAL_OINTMENT | CUTANEOUS | Status: DC | PRN
  Administered 2022-03-24: 1 via RECTAL
  Filled 2022-03-24: qty 28

## 2022-03-24 MED ORDER — DIPHENHYDRAMINE HCL 50 MG/ML IJ SOLN: 12.5000 mg | INTRAMUSCULAR | Status: DC | PRN

## 2022-03-24 MED ORDER — SODIUM CHLORIDE 0.9 % IV SOLN
INTRAVENOUS | Status: DC | PRN
  Administered 2022-03-24: 3 mL via EPIDURAL
  Administered 2022-03-24: 4 mL via EPIDURAL

## 2022-03-24 MED ORDER — OXYCODONE-ACETAMINOPHEN 5-325 MG PO TABS: 2.0000 | ORAL_TABLET | ORAL | Status: DC | PRN

## 2022-03-24 MED ORDER — MISOPROSTOL 25 MCG QUARTER TABLET
25.0000 ug | ORAL_TABLET | ORAL | Status: DC | PRN
  Administered 2022-03-24: 25 ug via ORAL
  Filled 2022-03-24: qty 1

## 2022-03-24 MED ORDER — TETANUS-DIPHTH-ACELL PERTUSSIS 5-2.5-18.5 LF-MCG/0.5 IM SUSY: 0.5000 mL | PREFILLED_SYRINGE | Freq: Once | INTRAMUSCULAR | Status: DC

## 2022-03-24 MED ORDER — DIPHENHYDRAMINE HCL 25 MG PO CAPS: 25.0000 mg | ORAL_CAPSULE | Freq: Four times a day (QID) | ORAL | Status: DC | PRN

## 2022-03-24 MED ORDER — ACETAMINOPHEN 325 MG PO TABS
650.0000 mg | ORAL_TABLET | ORAL | Status: DC | PRN
  Administered 2022-03-26: 650 mg via ORAL
  Filled 2022-03-24: qty 2

## 2022-03-24 MED ORDER — ONDANSETRON HCL 4 MG/2ML IJ SOLN: 4.0000 mg | Freq: Four times a day (QID) | INTRAMUSCULAR | Status: DC | PRN

## 2022-03-24 MED ORDER — MISOPROSTOL 25 MCG QUARTER TABLET
25.0000 ug | ORAL_TABLET | ORAL | Status: DC | PRN
  Administered 2022-03-24 (×3): 25 ug via VAGINAL
  Filled 2022-03-24 (×2): qty 1

## 2022-03-24 MED ORDER — SOD CITRATE-CITRIC ACID 500-334 MG/5ML PO SOLN: 30.0000 mL | ORAL | Status: DC | PRN

## 2022-03-24 MED ORDER — KETOROLAC TROMETHAMINE 30 MG/ML IJ SOLN: 30.0000 mg | Freq: Four times a day (QID) | INTRAMUSCULAR | Status: AC | PRN

## 2022-03-24 MED ORDER — NALOXONE HCL 0.4 MG/ML IJ SOLN: 0.4000 mg | INTRAMUSCULAR | Status: DC | PRN

## 2022-03-24 MED ORDER — MISOPROSTOL 200 MCG PO TABS
ORAL_TABLET | ORAL | Status: AC
  Administered 2022-03-24: 800 ug via RECTAL
  Filled 2022-03-24: qty 4

## 2022-03-24 MED ORDER — SENNOSIDES-DOCUSATE SODIUM 8.6-50 MG PO TABS
2.0000 | ORAL_TABLET | Freq: Every day | ORAL | Status: DC
  Administered 2022-03-26: 2 via ORAL
  Filled 2022-03-24 (×2): qty 2

## 2022-03-24 MED ORDER — NALOXONE HCL 4 MG/10ML IJ SOLN: 1.0000 ug/kg/h | INTRAVENOUS | Status: DC | PRN

## 2022-03-24 MED ORDER — MISOPROSTOL 25 MCG QUARTER TABLET: 25.0000 ug | ORAL_TABLET | ORAL | Status: DC | PRN

## 2022-03-24 MED ORDER — BENZOCAINE-MENTHOL 20-0.5 % EX AERO
1.0000 | INHALATION_SPRAY | CUTANEOUS | Status: DC | PRN
  Administered 2022-03-24: 1 via TOPICAL
  Filled 2022-03-24: qty 56

## 2022-03-24 MED ORDER — OXYCODONE-ACETAMINOPHEN 5-325 MG PO TABS: 1.0000 | ORAL_TABLET | ORAL | Status: DC | PRN

## 2022-03-24 MED ORDER — TERBUTALINE SULFATE 1 MG/ML IJ SOLN: 0.2500 mg | Freq: Once | INTRAMUSCULAR | Status: DC | PRN

## 2022-03-24 MED ORDER — EPHEDRINE 5 MG/ML INJ
10.0000 mg | INTRAVENOUS | Status: DC | PRN
  Administered 2022-03-24: 10 mg via INTRAVENOUS

## 2022-03-24 MED ORDER — OXYTOCIN BOLUS FROM INFUSION
333.0000 mL | Freq: Once | INTRAVENOUS | Status: AC
  Administered 2022-03-24: 333 mL via INTRAVENOUS

## 2022-03-24 MED ORDER — AMMONIA AROMATIC IN INHA
RESPIRATORY_TRACT | Status: AC
  Filled 2022-03-24: qty 10

## 2022-03-24 MED ORDER — WITCH HAZEL-GLYCERIN EX PADS
1.0000 | MEDICATED_PAD | CUTANEOUS | Status: DC | PRN
  Administered 2022-03-24: 1 via TOPICAL
  Filled 2022-03-24: qty 100

## 2022-03-24 MED ORDER — ONDANSETRON HCL 4 MG PO TABS: 4.0000 mg | ORAL_TABLET | ORAL | Status: DC | PRN

## 2022-03-24 MED ORDER — ZOLPIDEM TARTRATE 5 MG PO TABS: 5.0000 mg | ORAL_TABLET | Freq: Every evening | ORAL | Status: DC | PRN

## 2022-03-24 MED ORDER — LACTATED RINGERS IV SOLN
500.0000 mL | Freq: Once | INTRAVENOUS | Status: AC
  Administered 2022-03-24: 500 mL via INTRAVENOUS

## 2022-03-24 MED ORDER — LIDOCAINE HCL (PF) 1 % IJ SOLN
30.0000 mL | INTRAMUSCULAR | Status: DC | PRN
  Filled 2022-03-24: qty 30

## 2022-03-24 MED ORDER — IBUPROFEN 600 MG PO TABS
600.0000 mg | ORAL_TABLET | Freq: Four times a day (QID) | ORAL | Status: DC
  Administered 2022-03-24 – 2022-03-26 (×4): 600 mg via ORAL
  Filled 2022-03-24 (×7): qty 1

## 2022-03-24 MED ORDER — LACTATED RINGERS IV SOLN
500.0000 mL | INTRAVENOUS | Status: DC | PRN
  Administered 2022-03-24: 500 mL via INTRAVENOUS

## 2022-03-24 MED ORDER — OXYCODONE HCL 5 MG PO TABS
5.0000 mg | ORAL_TABLET | Freq: Four times a day (QID) | ORAL | Status: DC | PRN
  Filled 2022-03-24: qty 1

## 2022-03-24 MED ORDER — ONDANSETRON HCL 4 MG/2ML IJ SOLN: 4.0000 mg | INTRAMUSCULAR | Status: DC | PRN

## 2022-03-24 MED ORDER — SCOPOLAMINE 1 MG/3DAYS TD PT72: 1.0000 | MEDICATED_PATCH | Freq: Once | TRANSDERMAL | Status: DC

## 2022-03-24 MED ORDER — PRENATAL MULTIVITAMIN CH
1.0000 | ORAL_TABLET | Freq: Every day | ORAL | Status: DC
  Filled 2022-03-24 (×2): qty 1

## 2022-03-24 MED ORDER — MISOPROSTOL 200 MCG PO TABS: 800.0000 ug | ORAL_TABLET | Freq: Once | ORAL | Status: AC

## 2022-03-24 MED ORDER — SIMETHICONE 80 MG PO CHEW
80.0000 mg | CHEWABLE_TABLET | ORAL | Status: DC | PRN
  Filled 2022-03-24: qty 1

## 2022-03-24 MED ORDER — OXYTOCIN-SODIUM CHLORIDE 30-0.9 UT/500ML-% IV SOLN
2.5000 [IU]/h | INTRAVENOUS | Status: DC
  Filled 2022-03-24: qty 500

## 2022-03-24 MED ORDER — LACTATED RINGERS IV BOLUS
1000.0000 mL | Freq: Once | INTRAVENOUS | Status: AC
  Administered 2022-03-24: 1000 mL via INTRAVENOUS

## 2022-03-24 MED ORDER — MISOPROSTOL 25 MCG QUARTER TABLET
25.0000 ug | ORAL_TABLET | Freq: Once | ORAL | Status: DC
  Filled 2022-03-24: qty 1

## 2022-03-24 MED ORDER — SODIUM CHLORIDE 0.9% FLUSH: 3.0000 mL | INTRAVENOUS | Status: DC | PRN

## 2022-03-24 MED ORDER — ACETAMINOPHEN 325 MG PO TABS: 650.0000 mg | ORAL_TABLET | ORAL | Status: DC | PRN

## 2022-03-24 MED ORDER — EPHEDRINE 5 MG/ML INJ
10.0000 mg | INTRAVENOUS | Status: DC | PRN
  Administered 2022-03-24: 10 mg via INTRAVENOUS
  Filled 2022-03-24: qty 5

## 2022-03-24 MED ORDER — COCONUT OIL OIL
1.0000 | TOPICAL_OIL | Status: DC | PRN
  Administered 2022-03-24: 1 via TOPICAL
  Filled 2022-03-24 (×2): qty 7.5

## 2022-03-24 MED ORDER — DIPHENHYDRAMINE HCL 25 MG PO CAPS: 25.0000 mg | ORAL_CAPSULE | ORAL | Status: DC | PRN

## 2022-03-24 MED ORDER — PHENYLEPHRINE 80 MCG/ML (10ML) SYRINGE FOR IV PUSH (FOR BLOOD PRESSURE SUPPORT)
80.0000 ug | PREFILLED_SYRINGE | INTRAVENOUS | Status: DC | PRN
  Administered 2022-03-24: 80 ug via INTRAVENOUS
  Filled 2022-03-24: qty 10

## 2022-03-24 MED ORDER — FENTANYL-BUPIVACAINE-NACL 0.5-0.125-0.9 MG/250ML-% EP SOLN
12.0000 mL/h | EPIDURAL | Status: DC | PRN
  Filled 2022-03-24: qty 250

## 2022-03-24 MED ORDER — ACETAMINOPHEN 500 MG PO TABS
1000.0000 mg | ORAL_TABLET | Freq: Four times a day (QID) | ORAL | Status: AC
  Administered 2022-03-24 – 2022-03-25 (×2): 1000 mg via ORAL
  Filled 2022-03-24 (×4): qty 2

## 2022-03-24 MED ORDER — OXYTOCIN 10 UNIT/ML IJ SOLN
INTRAMUSCULAR | Status: AC
  Filled 2022-03-24: qty 2

## 2022-03-24 MED ORDER — MISOPROSTOL 25 MCG QUARTER TABLET
ORAL_TABLET | ORAL | Status: AC
  Administered 2022-03-24: 25 ug via ORAL
  Filled 2022-03-24: qty 1

## 2022-03-24 MED ORDER — FENTANYL CITRATE (PF) 100 MCG/2ML IJ SOLN
50.0000 ug | INTRAMUSCULAR | Status: DC | PRN
  Administered 2022-03-24 (×2): 50 ug via INTRAVENOUS
  Filled 2022-03-24: qty 2

## 2022-03-24 MED ORDER — MISOPROSTOL 25 MCG QUARTER TABLET
25.0000 ug | ORAL_TABLET | Freq: Once | ORAL | Status: AC
  Administered 2022-03-24: 25 ug via ORAL
  Filled 2022-03-24: qty 1

## 2022-03-24 MED ORDER — LACTATED RINGERS IV SOLN: INTRAVENOUS | Status: DC

## 2022-03-24 MED ORDER — ONDANSETRON HCL 4 MG/2ML IJ SOLN: 4.0000 mg | Freq: Three times a day (TID) | INTRAMUSCULAR | Status: DC | PRN

## 2022-03-24 NOTE — Discharge Summary (Signed)
OB Discharge Summary     Patient Name: Cheryl Macias DOB: 17-Sep-1975 MRN: [id number]  Date of admission: 03/24/2022 Delivering MD: Maggie Font, CNM  Date of Delivery: 03/24/2022  Date of discharge: 03/26/2022  Admitting diagnosis: Advanced maternal age in multigravida, third trimester [O09.523] Intrauterine pregnancy: [redacted]w[redacted]d    Secondary diagnosis: Gestational Diabetes medication controlled (A2)     Discharge diagnosis: Term Pregnancy Delivered and GDM A2                                                                                                Post partum procedures: None  Augmentation: AROM and Cytotec  Complications: None  Hospital course:  Onset of Labor With Vaginal Delivery      47y.o. yo [id number] 312w6das admitted for IOL on 03/24/2022. Labor course was complicated by intermittent Cat II strip, especially following epidural placement.  Membrane Rupture Time/Date: 8:03 PM ,03/24/2022   Delivery Method:Vaginal, Spontaneous  Episiotomy: None  Lacerations:   left labial lac, was hemostatic and did not require repair Patient had an uncomplicated postpartum course.  She is ambulating, tolerating a regular diet, passing flatus, and urinating well. Patient is discharged home in stable condition on 03/26/22.  Newborn Data: Birth date:03/24/2022  Birth time:8:38 PM  Gender: female Living status:Living  Apgars:7 ,9  Weight: 8lb 6oz  Subjective:   Physical exam  Vitals:   03/24/22 2045 03/24/22 2050 03/24/22 2100 03/24/22 2110  BP:   123/84 (!) 121/57  Pulse:   92 89  Resp:   18   Temp:   98.4 F (36.9 C)   TempSrc:   Oral   SpO2: 98% 100%    Weight:      Height:       General: alert, cooperative, and no distress Breast: soft, non-tender, nipples without breakdown Lochia: appropriate Uterine Fundus: firm Perineum: no erythema or foul odor discharge, minimal edema DVT Evaluation: No evidence of DVT seen on physical exam.  Labs: Lab Results  Component Value  Date   WBC 5.8 03/24/2022   HGB 11.7 (L) 03/24/2022   HCT 35.1 (L) 03/24/2022   MCV 87.3 03/24/2022   PLT 191 03/24/2022    Discharge instruction: in After Visit Summary.  Medications:  Allergies as of 03/26/2022   No Known Allergies      Medication List     STOP taking these medications    aspirin EC 81 MG tablet   blood glucose meter kit and supplies   metFORMIN 500 MG tablet Commonly known as: GLUCOPHAGE   pantoprazole 20 MG tablet Commonly known as: Protonix       TAKE these medications    calcium carbonate 500 MG chewable tablet Commonly known as: TUMS - dosed in mg elemental calcium Chew 1 tablet by mouth daily.   cholecalciferol 1000 units tablet Commonly known as: VITAMIN D Take 1,000 Units by mouth daily.   ferrous sulfate 325 (65 FE) MG EC tablet Take 325 mg by mouth 3 (three) times daily with meals.   prenatal multivitamin Tabs tablet Take 1 tablet by mouth daily  at 12 noon.   valACYclovir 1000 MG tablet Commonly known as: VALTREX TAKE 1 TABLET BY MOUTH DAILY           Activity: Advance as tolerated. Pelvic rest for 6 weeks.   Outpatient follow up:  Follow-up [facility] at [facility] Follow up.   Specialty: Obstetrics and Gynecology Why: call for a telehealth visit at two weeks and in person postpartum visit at six weeks postpartum Contact information: [address] [place] SSN-[id number] [phone]                  Postpartum contraception:  considering paraguard Rhogam Given postpartum: not indicated Rubella vaccine given postpartum: not indicated Varicella vaccine given postpartum: not indicated TDaP given antepartum or postpartum: received 12/29/21  Newborn Data: Live born child  Birth Weight:  8lb 6oz APGAR: 7, 9  Newborn Delivery   Birth date/time: 03/24/2022 20:38:01 Delivery type: Vaginal, Spontaneous       Baby Feeding:  Breast  Disposition:home with mother   Lurlean Horns, CNM 03/26/22 9:23 AM

## 2022-03-24 NOTE — Progress Notes (Signed)
  Labor Progress Note   47 y.o. H9M9311 @ [redacted]w[redacted]d called to room to assess patient as she is feeling pressure and requesting pain meds   Subjective:  Increased contractions for past 1.5 hours, feeling contractions, breathing through them, also having pelvic pressure  Objective:  BP 127/64   Pulse 72   Temp 98.3 F (36.8 C) (Oral)   Resp 17   Ht '5\' 4"'$  (1.626 m)   Wt 85.8 kg   LMP 06/18/2021 (Exact Date)   BMI 32.46 kg/m  Abd: gravid SVE: 2cm/100%/-2, vertex  EFM: 135, mod variability, pos accels, occasional variable decels Toco: Ucs q2-434m   Assessment  G4P2012 @ 3972w6dtive/latent labor VSS FHR Cat II but overall reassuring   Plan:   1. IV fentanyl for pain management 2. Plan SVE in 2-3 hours, will hold cytotec, has finished effacement  All discussed with patient  [place] 03/24/2022  5:35 PM

## 2022-03-24 NOTE — H&P (Signed)
OB History & Physical   History of Present Illness:  Chief Complaint: IOL for GDMA2  HPI:  Cheryl Macias is a 47 y.o. S9G2836 female at 41w6ddated by LMP.  Her pregnancy has been complicated by: Elderly multigravida, currently pregnant; Family history of genetic disorder; history of neonatal demise; history of myomectomy; Newborn product of in vitro fertilization (IVF) pregnancy; History of herpes genitalis; Polyhydramnios affecting pregnancy in third trimester- resolved; AMA (advanced maternal age) multigravida 422+ obesity; and medication controlled gestational diabetes mellitus (GDM) in third trimester .    She denies contractions. She feels some cramping with initial dose of cytotec.  She denies leakage of fluid.   She denies vaginal bleeding.   She reports fetal movement.    Total weight gain for pregnancy: 13.2 kg   Obstetrical Problem List: fourth Problems (from 08/24/21 to present)     Nursing Staff Provider  Office Location [facility] Dating   LMP = 6 wk [place] IVF pregnancy  Language  English Anatomy [place]  complete  Flu Vaccine  Declined Genetic Screen  NIPS:  Panorama nml  TDaP vaccine   12/29/2021 Hgb A1C or  GTT Third trimester :   Rhogam   not needed   LAB RESULTS   Feeding Plan  Breast Blood Type O/Positive/-- (11/24 1550)   Contraception Paragard? 2/6 Antibody Negative (11/24 1550)  Circumcision Desired  Rubella 1.96 (11/24 1550)  Pediatrician  Not yet 12/19 RPR Non Reactive (11/24 1550)   Support Person   FLyman Speller  HBsAg Negative (11/24 1550)   Prenatal Classes  declines HIV Non Reactive (11/24 1550)    Varicella immune  BTL Consent  GBS  (For PCN allergy, check sensitivities)        VBAC Consent   Pap 11/04/2021. NML. 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 Authorizing Provider  aspirin EC 81 MG tablet Take 81 mg by mouth daily. Swallow whole.   Yes [provider]  blood glucose meter kit and supplies Dispense based on patient and insurance preference. Use up to four times daily as directed. (FOR ICD-9 250.00, 250.01). 12/30/21  Yes EHarlin Heys MD  calcium carbonate (TUMS - DOSED IN MG ELEMENTAL CALCIUM) 500 MG chewable tablet Chew 1 tablet by mouth daily.   Yes [provider]  metFORMIN (GLUCOPHAGE) 500 MG tablet Take 1 tablet (500 mg total) by mouth 2 (two) times daily with a meal. 02/17/22  Yes Dominic, LNunzio Cobbs CNM  Prenatal Vit-Fe Fumarate-FA (PRENATAL MULTIVITAMIN) TABS tablet Take 1 tablet by mouth daily at 12 noon.   Yes [provider]  valACYclovir (VALTREX) 1000 MG tablet TAKE 1 TABLET BY MOUTH DAILY  02/16/22  Yes Harlin Heys, MD  cholecalciferol (VITAMIN D) 1000 units tablet Take 1,000 Units by mouth daily. Patient not taking: Reported on 01/26/2022    [provider]  ferrous sulfate 325 (65 FE) MG EC tablet Take 325 mg by mouth 3 (three) times daily with meals. Patient not taking: Reported on 01/17/2022    [provider]  pantoprazole (PROTONIX) 20 MG tablet Take 1 tablet (20 mg total) by mouth 2 (two) times daily. Patient not taking: Reported on 01/17/2022 10/26/21   Rubie Maid, MD    OB History  Gravida Para Term Preterm AB Living  '4 2 2   1 2  '$ SAB IAB Ectopic Multiple Live Births  0 1     2    # Outcome Date GA Lbr Len/2nd Weight Sex Delivery Anes PTL  Lv  4 Current           3 Term 07/24/20 [redacted]w[redacted]d 2041 g M Vag-Spont  N DEC     Complications: Trisomy 18  2 Term 09/21/01 348w0d3090 g M Vag-Spont  N LIV  1 IAB             Prenatal care site: [facility]  Social History: She  reports that she has never smoked. She has never used smokeless tobacco. She reports that she does not currently use alcohol. She reports that she does not use drugs.  Family History: family history includes Diabetes in her paternal grandmother; Hypertension in her father; Lupus in her father.    Review of Systems:  Review of Systems  Constitutional:  Negative for chills and fever.  HENT:  Negative for congestion, ear discharge, ear pain, hearing loss, sinus pain and sore throat.   Eyes:  Negative for blurred vision and double vision.  Respiratory:  Negative for cough, shortness of breath and wheezing.   Cardiovascular:  Negative for chest pain, palpitations and leg swelling.  Gastrointestinal:  Negative for abdominal pain, blood in stool, constipation, diarrhea, heartburn, melena, nausea and vomiting.  Genitourinary:  Negative for dysuria, flank pain, frequency, hematuria and urgency.  Musculoskeletal:  Negative for back pain, joint pain and myalgias.  Skin:  Negative for itching and rash.  Neurological:  Negative for dizziness, tingling, tremors, sensory change, speech change, focal weakness, seizures, loss of consciousness, weakness and headaches.  Endo/Heme/Allergies:  Negative for environmental allergies. Does not bruise/bleed easily.  Psychiatric/Behavioral:  Negative for depression, hallucinations, memory loss, substance abuse and suicidal ideas. The patient is not nervous/anxious and does not have insomnia.      Physical Exam:  BP 131/84 (BP Location: Right Arm)   Pulse 82   Temp 98.1 F (36.7 C)   Resp 16   Ht '5\' 4"'$  (1.626 m)   Wt 85.8 kg   LMP 06/18/2021 (Exact Date)   BMI 32.46 kg/m   Constitutional: Well nourished, well developed female in  no acute distress.  HEENT: normal Skin: Warm and dry.  Cardiovascular: Regular rate and rhythm.   Extremity:  no edema   Respiratory: Clear to auscultation bilateral. Normal respiratory effort Abdomen: FHT present Back: no CVAT Psych: Alert and Oriented x3. No memory deficits. Normal mood and affect.    Pelvic exam: per RN 1/30/-3   Baseline FHR: 130 beats/min   Variability: moderate   Accelerations: present   Decelerations: absent Contractions: present frequency: every 3-6 Overall assessment: reassuring   No results found for: "SARSCOV2NAA"]  Assessment:  Cheryl Yorkss a 89.o. G4B2I2035emale  [place]   Plan:  Admit to Labor & Delivery  CBC, T&S, Clrs, IVF GBS negative.   Fetal well-being: Category I GDM: check BG q 4 hours    JRod Can CGreat Plains Regional [facility] Center2/09/2022 7:29 AM

## 2022-03-24 NOTE — Progress Notes (Signed)
  Labor Progress Note   47 y.o. I2H7981 @ [redacted]w[redacted]d IOL for GDM, has now received one dose of misoprostol, 253m PO and 2594mvaginally.   Subjective:  Resting comfortably in bed, feels that contractions are strengthening, rates them 3/10, feels like cramping pain.  Objective:  BP 128/71 (BP Location: Right Arm)   Pulse 65   Temp 98.3 F (36.8 C) (Oral)   Resp 17   Ht '5\' 4"'$  (1.626 m)   Wt 85.8 kg   LMP 06/18/2021 (Exact Date)   BMI 32.46 kg/m  Abd: gravid SVE: 1cm/30%/-1  EFM: 130, mod variability, pos accels Toco: Ucs q3-6mi71m Assessment  G4P2012 @ 39w649w6dfor GDM VSS FHR Cat I Not laboring   Plan:   1. Will continue cervical ripening with second dose of misoprostol 25mcg23m5mcgPV 2. Plans to recheck SVE within about 4 hours or sooner PRN.  All discussed with patient  MarciaMaggie FontFNP AlNorth [place]ningN 03/24/2022  10:31 AM

## 2022-03-24 NOTE — Anesthesia Preprocedure Evaluation (Addendum)
Anesthesia Evaluation  Patient identified by MRN, date of birth, ID band Patient awake    Reviewed: Allergy & Precautions, NPO status , Patient's Chart, lab work & pertinent test results  Airway Mallampati: III  TM Distance: >3 FB Neck ROM: full    Dental  (+) Chipped   Pulmonary neg pulmonary ROS   Pulmonary exam normal        Cardiovascular Exercise Tolerance: Good negative cardio ROS Normal cardiovascular exam     Neuro/Psych    GI/Hepatic negative GI ROS,,,  Endo/Other  diabetes    Renal/GU   negative genitourinary   Musculoskeletal   Abdominal   Peds  Hematology negative hematology ROS (+)   Anesthesia Other Findings Past Medical History: No date: Abnormal Pap smear No date: Anemia No date: Gestational diabetes 02/2020: Gonorrhea 12/29/2021: History of herpes genitalis No date: Prediabetes No date: Vitamin D deficiency  Past Surgical History: No date: BUNIONECTOMY     Comment:  BOTH FEET No date: CRYOTHERAPY No date: MYOMECTOMY     Comment:  vaginal  BMI    Body Mass Index: 32.46 kg/m      Reproductive/Obstetrics (+) Pregnancy                              Anesthesia Physical Anesthesia Plan  ASA: 2  Anesthesia Plan: Epidural   Post-op Pain Management:    Induction:   PONV Risk Score and Plan: 2  Airway Management Planned: Natural Airway  Additional Equipment:   Intra-op Plan:   Post-operative Plan:   Informed Consent: I have reviewed the patients History and Physical, chart, labs and discussed the procedure including the risks, benefits and alternatives for the proposed anesthesia with the patient or authorized representative who has indicated his/her understanding and acceptance.     Dental Advisory Given  Plan Discussed with: Anesthesiologist  Anesthesia Plan Comments: (Patient reports no bleeding problems and no anticoagulant use.   Patient  consented for risks of anesthesia including but not limited to:  - adverse reactions to medications - risk of bleeding, infection and or nerve damage from epidural that could lead to paralysis - risk of headache or failed epidural - nerve damage due to positioning - that if epidural is used for C-section that there is a chance of epidural failure requiring spinal placement or conversion to GA - Damage to heart, brain, lungs, other parts of body or loss of life  Patient voiced understanding.)         Anesthesia Quick Evaluation

## 2022-03-24 NOTE — Progress Notes (Signed)
  Labor Progress Note   47 y.o. P2T6244 @ [redacted]w[redacted]d  Subjective:  Resting, is able to doze off. Reports pain 3/10 with contractions that feels like cramping.  Objective:  BP 127/81 (BP Location: Left Arm)   Pulse 66   Temp 97.9 F (36.6 C) (Oral)   Resp 17   Ht '5\' 4"'$  (1.626 m)   Wt 85.8 kg   LMP 06/18/2021 (Exact Date)   BMI 32.46 kg/m  Abd: gravid SVE: 1cm/50%/-2, soft  EFM: 135, mod variability, pos accels, no decels Toco: Ucs q3-634m   Assessment  G4P2012 @ 3933w6dL VSS FHR Cat I   Plan:   1. Will repeat cytotec dosing with 7m60mV/7mc42m 2. Plan SVE in four hours or sooner PRN.  All discussed with patient  MarciMaggie Font FNP [place] 03/24/2022  2:37 PM

## 2022-03-24 NOTE — Anesthesia Procedure Notes (Signed)
Epidural Patient location during procedure: OB Start time: 03/24/2022 7:00 PM End time: 03/24/2022 7:22 PM  Staffing Anesthesiologist: Dimas Millin, MD Performed: anesthesiologist   Preanesthetic Checklist Completed: patient identified, IV checked, site marked, risks and benefits discussed, surgical consent, monitors and equipment checked, pre-op evaluation and timeout performed  Epidural Patient position: sitting Prep: Betadine Patient monitoring: heart rate, continuous pulse ox and blood pressure Approach: midline Location: L3-L4 Injection technique: LOR saline  Needle:  Needle type: Tuohy  Needle gauge: 18 G Needle length: 9 cm and 9 Needle insertion depth: 6 cm Catheter type: closed end flexible Catheter size: 20 Guage Catheter at skin depth: 11 cm Test dose: negative and 1.5% lidocaine with Epi 1:200 K  Assessment Sensory level: T4 Events: blood not aspirated, no cerebrospinal fluid, injection not painful, no injection resistance, no paresthesia and negative IV test  Additional Notes Reason for block:procedure for pain

## 2022-03-25 LAB — CERVICOVAGINAL ANCILLARY ONLY
Chlamydia: NEGATIVE
Comment: NEGATIVE
Comment: NEGATIVE
Comment: NORMAL
Neisseria Gonorrhea: NEGATIVE
Trichomonas: NEGATIVE

## 2022-03-25 LAB — GLUCOSE, CAPILLARY
Glucose-Capillary: 79 mg/dL (ref 70–99)
Glucose-Capillary: 113 mg/dL — ABNORMAL HIGH (ref 70–99)
Glucose-Capillary: 85 mg/dL (ref 70–99)

## 2022-03-25 LAB — CBC
HCT: 31.3 % — ABNORMAL LOW (ref 36.0–46.0)
Hemoglobin: 10.4 g/dL — ABNORMAL LOW (ref 12.0–15.0)
MCH: 29.6 pg (ref 26.0–34.0)
MCHC: 33.2 g/dL (ref 30.0–36.0)
MCV: 89.2 fL (ref 80.0–100.0)
Platelets: 168 10*3/uL (ref 150–400)
RBC: 3.51 MIL/uL — ABNORMAL LOW (ref 3.87–5.11)
RDW: 13.2 % (ref 11.5–15.5)
WBC: 8.1 10*3/uL (ref 4.0–10.5)
nRBC: 0 % (ref 0.0–0.2)

## 2022-03-25 NOTE — Anesthesia Postprocedure Evaluation (Signed)
Anesthesia Post Note  Patient: Cheryl Macias  Procedure(s) Performed: AN AD HOC LABOR EPIDURAL  Patient location during evaluation: Mother Baby Anesthesia Type: Epidural Level of consciousness: awake and alert Pain management: pain level controlled Vital Signs Assessment: post-procedure vital signs reviewed and stable Respiratory status: spontaneous breathing, nonlabored ventilation and respiratory function stable Cardiovascular status: stable Postop Assessment: no headache, no backache and epidural receding Anesthetic complications: no  No notable events documented.   Last Vitals:  Vitals:   03/25/22 0028 03/25/22 0339  BP: 122/66 129/78  Pulse: 75 76  Resp: 18 18  Temp: 37.4 C 37.6 C  SpO2: 98% 95%    Last Pain:  Vitals:   03/25/22 0339  TempSrc: Oral  PainSc:                  Norm Salt

## 2022-03-25 NOTE — Progress Notes (Signed)
   03/25/22 1100  Spiritual Encounters  Type of Visit Initial  Care provided to: Patient  Conversation partners present during encounter Nurse  Referral source Patient request  Reason for visit Advance directives  OnCall Visit No  Spiritual Framework  Presenting Themes Goals in life/care  Interventions  Spiritual Care Interventions Made Decision-making support/facilitation;Established relationship of care and support  Intervention Outcomes  Outcomes Connected to spiritual community;Patient family open to [place] will continue to follow   Melven Sartorius responded to Spiritual care Consult. Pt desired to complete the Adv Dir but Checking with the nurse there were no copies available on the Unit. Chap requested the nurse to order copies for the unit. Will circle back with a copy when I lay my hand on one.

## 2022-03-25 NOTE — Progress Notes (Signed)
Post Partum Day 1 Subjective: no complaints, up ad lib, voiding, and tolerating PO  Objective: Blood pressure 131/68, pulse 63, temperature 98 F (36.7 C), temperature source Oral, resp. rate 20, height 5' 4"$  (1.626 m), weight 85.8 kg, last menstrual period 06/18/2021, SpO2 100 %, unknown if currently breastfeeding.  Physical Exam:  General: alert, cooperative, and no distress Lochia: appropriate Uterine Fundus: firm Incision: NA DVT Evaluation: No evidence of DVT seen on physical exam. Negative Homan's sign. No cords or calf tenderness.  Recent Labs    03/24/22 0629 03/25/22 0631  HGB 11.7* 10.4*  HCT 35.1* 31.3*    Assessment/Plan: Plan for discharge tomorrow Her glucose readings have been WNL today.    LOS: 1 day   Imagene Riches, CNM 03/25/2022, 4:01 PM

## 2022-03-25 NOTE — Lactation Note (Addendum)
This note was copied from a baby's chart. Lactation Consultation Note  Patient Name: Cheryl Macias [id number] Date: 03/25/2022 Reason for consult: Initial assessment;Term;Mother's request;Breastfeeding assistance;RN request Age:45 hours  Maternal Data This is mom's 3rd baby, SVD. Mom with history of history of neonatal demise (trisomy 18y), IVF pregnancy, AMA,medication controlled gestational diabetes mellitus. Mom is an experienced breastfeeding mother. On initial consult today Mom concerned she is not producing enough as she tried to pump to see if she was producing milk and "did not get anything." Has patient been taught Hand Expression?: Yes Does the patient have breastfeeding experience prior to this delivery?: Yes How long did the patient breastfeed?: 20month with first child  Feeding Mother's Current Feeding Choice: Breast Milk Assisted mother with breastfeeding. Baby readily latched. Assisted mom with upper lip latch as lip was slightly rolled in Provided tips and strategies to maximize position and latch techniques.Baby fed well with multiple audible swallows. Mom could identify the swallows once explained what swallows were. Mom reports she feels better knowing baby is eating. Discussed it is normal if pumping to see little or nothing at first. Encouraged mom to continue with normative breastfeeding. LATCH Score Latch: Grasps breast easily, tongue down, lips flanged, rhythmical sucking.  Audible Swallowing: Spontaneous and intermittent  Type of Nipple: Everted at rest and after stimulation  Comfort (Breast/Nipple): Soft / non-tender  Hold (Positioning): No assistance needed to correctly position infant at breast.  LATCH Score: 10   Interventions Interventions: Breast feeding basics reviewed;Assisted with latch;Education Support and encouragement provided.  Discharge Discharge Education: Engorgement and breast care;Warning signs for feeding baby;Outpatient  recommendation Pump: Personal  Consult Status Consult Status: PRN  Update provided to care nurse.  PJonna Clark2/10/2022, 2:29 PM

## 2022-03-25 NOTE — Progress Notes (Signed)
   03/25/22 1153  Spiritual Encounters  Type of Visit Follow up  Care provided to: Patient  Conversation partners present during encounter Nurse  Referral source Chaplain assessment  Reason for visit Advance directives  OnCall Visit No   This Chap delivered Adv Dir Forms to the patient as promised.Pt will fill and call the Chaplain as need arise.

## 2022-03-26 LAB — GLUCOSE, CAPILLARY: Glucose-Capillary: 71 mg/dL (ref 70–99)

## 2022-03-26 NOTE — Discharge Instructions (Signed)
Call the office to schedule your 2 week telehealth visit and 6 week postpartum visit.  If you have questions or concerns please call your oncall provider.  If you have urgent concerns please go to the nearest emergency department for evaluation.

## 2022-03-26 NOTE — TOC Initial Note (Signed)
Transition of Care [facility]) - Initial/Assessment Note    Patient Details  Name: Cheryl Macias MRN: [id number] Date of Birth: [birth year]  Transition of Care [facility]) CM/SW Contact:    [address], [place] Phone Number: 03/26/2022, 10:05 AM  Clinical Narrative:                  CSW received consult, spoke with pt via phone. Concern for pt's answer to question 10 on the Edinburgh. CSW reassessed with pt, she states she thought about the question in a different way, she states she thought about the question based off of something happening to her, something catastrophic that would be out of her control. Pt denies any desire to harm herself. CSW assessed pt with the [place] Suicide Severity Risk Scale, specifically questions one and two as the directions stated, pt answered no to all of the questions (about suicide plans, wishing to be dead, SI). Mom states she has access to an outpatient provider if need be but doesn't feel that is necessary at this time, politely declined any additional resources than the ones already offered. No barriers to dc.        Patient Goals and CMS Choice            Expected Discharge Plan and Services         Expected Discharge Date: 03/26/22                                    Prior Living Arrangements/Services                       Activities of Daily Living Home Assistive Devices/Equipment: None ADL Screening (condition at time of admission) Patient's cognitive ability adequate to safely complete daily activities?: Yes Is the patient deaf or have difficulty hearing?: No Does the patient have difficulty seeing, even when wearing glasses/contacts?: No Does the patient have difficulty concentrating, remembering, or making decisions?: No Patient able to express need for assistance with ADLs?: Yes Does the patient have difficulty dressing or bathing?: No Independently performs ADLs?: Yes (appropriate for developmental age) Does the  patient have difficulty walking or climbing stairs?: No Weakness of Legs: None Weakness of Arms/Hands: None  Permission Sought/Granted                  Emotional Assessment              Admission diagnosis:  Advanced maternal age in multigravida, third trimester [O09.523] Patient Active Problem List   Diagnosis Date Noted   Advanced maternal age in multigravida, third trimester 03/24/2022   [redacted] weeks gestation of pregnancy 03/22/2022   Screen for STD (sexually transmitted disease) 03/22/2022   Pregnancy w/ hx of uterine myomectomy 03/22/2022   Gestational diabetes mellitus (GDM) controlled on oral hypoglycemic drug 03/22/2022   AMA (advanced maternal age) multigravida 35+, third trimester 01/25/2022   History of herpes genitalis 12/29/2021   Pregnancy, supervision, high-risk 10/26/2021   Family history of genetic disorder 08/31/2021   Newborn product of in vitro fertilization (IVF) pregnancy 08/31/2021   Osteoarthritis of knee 02/01/2018   PCP:  [place]:   [facility] [address], [place] - [place] [address] Phone: [phone] Fax: [place], [place]  [place] [address] Phone: [phone] Fax: [phone]     Social Determinants of Health (SDOH) Social History: SDOH Screenings   Food Insecurity: No Food Insecurity (03/24/2022)  Housing: Low Risk  (03/24/2022)  Transportation Needs: No Transportation Needs (03/24/2022)  Utilities: Not At Risk (03/24/2022)  Depression (PHQ2-9): Low Risk  (02/17/2022)  Financial Resource Strain: Low Risk  (08/24/2021)  Physical Activity: Insufficiently Active (08/24/2021)  Social Connections: Moderately Integrated (08/24/2021)  Stress: No Stress Concern Present (08/24/2021)  Tobacco Use: Low Risk  (03/24/2022)   SDOH Interventions:     Readmission Risk Interventions      No data to display

## 2022-03-26 NOTE — Final Progress Note (Signed)
Post Partum Day 2 Subjective: Cheryl Macias is feeling well overall. She is ambulating, voiding, and tolerating POs without difficulty. Her pain is well-controlled and her bleeding is WNL. Her mood is stable and she states she has no concerns about depression or anxiety. EPDS is 5 but she answered "hardly ever" to the last question. Breastfeeding is going well.   Objective: Blood pressure (!) 112/57, pulse (!) 59, temperature 98.2 F (36.8 C), temperature source Oral, resp. rate 18, height 5' 4"$  (1.626 m), weight 85.8 kg, last menstrual period 06/18/2021, SpO2 97 %, unknown if currently breastfeeding.  Physical Exam:  General: alert, cooperative, and appears stated age Heart: RRR Lungs: CTAB Abdomen: soft, non-tender, normal BS Lochia: appropriate Uterine Fundus: firm Incision: N/A DVT Evaluation: No evidence of DVT seen on physical exam.  Recent Labs    03/24/22 0629 03/25/22 0631  HGB 11.7* 10.4*  HCT 35.1* 31.3*    Assessment/Plan: Discharge home [facility] consult prior to d/c  Considering Paragard for contraception Video visit in 2 weeks Office visit in 6 weeks Reviewed discharge instructions   LOS: 2 days   Lurlean [place], CNM 03/26/2022, 9:25 AM

## 2022-03-26 NOTE — Lactation Note (Signed)
This note was copied from a baby's chart. Lactation Consultation Note  Patient Name: Cheryl Macias [id number] Date: 03/26/2022 Reason for consult: Follow-up assessment;Term Age:45 hours Mom and baby for d/c today Maternal Data Has patient been taught Hand Expression?: Yes Does the patient have breastfeeding experience prior to this delivery?: Yes How long did the patient breastfeed?: 6 mths  Feeding Mother's Current Feeding Choice: Breast Milk BAby asleep, no feeding observed, mom states baby latches well, has more difficulty with right, but able to latch to right at last feeding LATCH Score Latch:  (feeding not observed , baby asleep)                  Lactation Tools Discussed/Used  [place] name and no written on white board  Interventions Interventions: Education  Discharge Pump: Personal [place] Program: No  Consult Status Consult Status: PRN    Ferol Luz 03/26/2022, 11:03 AM

## 2022-03-26 NOTE — Progress Notes (Signed)
Discharge instructions reviewed and printed copies given to patient for resource after discharge home.  Follow up appointment to be made on Monday when office opens.  Questions answered.

## 2022-03-29 ENCOUNTER — Encounter: Payer: Self-pay | Admitting: Obstetrics and Gynecology

## 2022-03-29 LAB — HIV ANTIBODY (ROUTINE TESTING W REFLEX): HIV Screen 4th Generation wRfx: NONREACTIVE

## 2022-03-29 LAB — HSV 1 AND 2 AB, IGG
HSV 1 Glycoprotein G Ab, IgG: 55.6 index — ABNORMAL HIGH (ref 0.00–0.90)
HSV 2 IgG, Type Spec: <0.91 index (ref 0.00–0.90)

## 2022-03-29 LAB — RPR: RPR Ser Ql: NONREACTIVE

## 2022-04-07 ENCOUNTER — Telehealth: Payer: Managed Care, Other (non HMO) | Admitting: Obstetrics

## 2022-04-07 DIAGNOSIS — O24415 Gestational diabetes mellitus in pregnancy, controlled by oral hypoglycemic drugs: Secondary | ICD-10-CM

## 2022-04-07 NOTE — Progress Notes (Signed)
Postpartum Visit  Chief Complaint:  Chief Complaint  Patient presents with   Postpartum Care    Patient presents for two week post partum check up, patient reports that her and baby are doing well. Patient states that she is breast feeding and is alternating with [employer] Goat Milk. Patient reports that are no urinary or bowel concerns for her or baby, she reports that she has some light vaginal bleeding but denies pain.   This was a virtual visit. I identified the patient by name. The visit was via phone and the people present were Mercie Eon and Gigi Gin CNM She is well- somewhat tired. Her son is doing well. Was circed recently. She denies any depressive sxs. Has scant lochia. I snot taking any Metformin and is not checking her sugars. Wondering about glucose testing at her PP visit.   History of Present Illness: Patient is a 47 y.o. [id number] presents for  a 2 week virtual postpartum visit.  Date of delivery: 03/24/2021 Type of delivery: Vaginal delivery - Vacuum or forceps assisted  no Episiotomy No.  Laceration: small tear, not repaired Pregnancy or labor problems:  Gestational diabetes, AMA Any problems since the delivery:  no  Newborn Details:  SINGLETON :  1. Baby's name: boy. Birth weight: 8lbs 6 oz Maternal Details:  Breast Feeding:  yes Post partum depression/anxiety noted:  no Edinburgh Post-Partum Depression Score:  0    Past Medical History:  Diagnosis Date   Abnormal Pap smear    Anemia    Gestational diabetes    Gonorrhea 02/2020   History of herpes genitalis 12/29/2021   Prediabetes    Vitamin D deficiency     Past Surgical History:  Procedure Laterality Date   BUNIONECTOMY     BOTH FEET   CRYOTHERAPY     MYOMECTOMY     vaginal    Prior to Admission medications   Medication Sig Start Date End Date Taking? Authorizing Provider  ferrous sulfate 325 (65 FE) MG EC tablet Take 325 mg by mouth 3 (three) times daily with meals.   Yes [provider]  Prenatal Vit-Fe Fumarate-FA (PRENATAL MULTIVITAMIN) TABS tablet Take 1 tablet by mouth daily at 12 noon.   Yes [provider]  valACYclovir (VALTREX) 1000 MG tablet TAKE 1 TABLET BY MOUTH DAILY 02/16/22  Yes Harlin Heys, MD  calcium carbonate (TUMS - DOSED IN MG ELEMENTAL CALCIUM) 500 MG chewable tablet Chew 1 tablet by mouth daily. Patient not taking: Reported on 04/07/2022    [provider]  cholecalciferol (VITAMIN D) 1000 units tablet Take 1,000 Units by mouth daily. Patient not taking: Reported on 01/26/2022    [provider]    No Known Allergies   Social History   Socioeconomic History   Marital status: Married    Spouse name: Eddie Dibbles   Number of children: 1   Years of education: 18   Highest education level: Not on file  Occupational History   Occupation: Firefighter: [employer]  Tobacco Use   Smoking status: Never   Smokeless tobacco: Never  Vaping Use   Vaping Use: Never used  Substance and Sexual Activity   Alcohol use: Not Currently   Drug use: No   Sexual activity: Yes    Partners: Male    Birth control/protection: None  Other Topics Concern   Not on file  Social History Narrative   Not on file   Social Determinants of Health  Financial Resource Strain: Low Risk  (08/24/2021)   Overall Financial Resource Strain (CARDIA)    Difficulty of Paying Living Expenses: Not hard at all  Food Insecurity: No Food Insecurity (03/24/2022)   Hunger Vital Sign    Worried About Running Out of Food in the Last Year: Never true    Ran Out of Food in the Last Year: Never true  Transportation Needs: No Transportation Needs (03/24/2022)   PRAPARE - Hydrologist (Medical): No    Lack of Transportation (Non-Medical): No  Physical Activity: Insufficiently Active (08/24/2021)   Exercise Vital Sign    Days of Exercise per Week: 3 days    Minutes of Exercise per Session: 30 min  Stress: No Stress  Concern Present (08/24/2021)   [place]    Feeling of Stress : Not at all  Social Connections: Moderately Integrated (08/24/2021)   Social Connection and Isolation Panel [NHANES]    Frequency of Communication with Friends and Family: Twice a week    Frequency of Social Gatherings with Friends and Family: Once a week    Attends Religious Services: Never    Marine scientist or Organizations: Yes    Attends Archivist Meetings: Never    Marital Status: Married  Human resources officer Violence: Not At Risk (08/24/2021)   Humiliation, Afraid, Rape, and Kick questionnaire    Fear of Current or Ex-Partner: No    Emotionally Abused: No    Physically Abused: No    Sexually Abused: No    Family History  Problem Relation Age of Onset   Hypertension Father    Lupus Father    Diabetes Paternal Grandmother    Breast cancer Neg Hx     Review of Systems  Constitutional: Negative.   HENT: Negative.    Eyes: Negative.   Respiratory: Negative.    Cardiovascular: Negative.   Gastrointestinal: Negative.   Genitourinary: Negative.   Musculoskeletal: Negative.   Skin: Negative.   Neurological:        One of her big toes continues to feel numb. She is wondering if this is due to her epidural. Will reach out to anesthesia.  Endo/Heme/Allergies: Negative.   Psychiatric/Behavioral: Negative.       Physical Exam- deferred- virtual visit. There were no vitals taken for this visit.     Female Chaperone present during breast and/or pelvic exam.  Assessment: 47 y.o. [phone] presenting for 2week virtual postpartum visit  Plan: Problem List Items Addressed This Visit       Endocrine   Gestational diabetes mellitus (GDM) controlled on oral hypoglycemic drug - Primary   Relevant Orders   Glucose tolerance, 2 hours     Other   Postpartum care following vaginal delivery   Relevant Orders   Glucose tolerance, 2 hours      1) She wil RTC in 4 weeks for her 6 week PP visit. I have ordered a 2 hr GTT for her as she was treated with medication for GDM. 2) Patient underwent screening for postpartum depression with no concerns noted.  She is reaching out to anesthesia to f/u on her ongoing numbness in her big toe that developed after her epidural was placed.   Imagene Riches, CNM  04/07/2022 6:04 PM   04/07/2022 5:59 PM

## 2022-04-07 NOTE — Progress Notes (Signed)
Edinburgh Postnatal Depression Scale - 04/07/22 1505       Edinburgh Postnatal Depression Scale:  In the Past 7 Days   I have been able to laugh and see the funny side of things. 0    I have looked forward with enjoyment to things. 0    I have blamed myself unnecessarily when things went wrong. 0    I have been anxious or worried for no good reason. 0    I have felt scared or panicky for no good reason. 0    Things have been getting on top of me. 0    I have been so unhappy that I have had difficulty sleeping. 0    I have felt sad or miserable. 0    I have been so unhappy that I have been crying. 0    The thought of harming myself has occurred to me. 0    Edinburgh Postnatal Depression Scale Total 0

## 2022-04-20 IMAGING — MG MM DIGITAL SCREENING BILAT W/ TOMO AND CAD
6 of 10 series · 6 of 30 positions shown · non-contrast
Comparison: Previous exam(s).

CLINICAL DATA: Screening.

EXAM:
DIGITAL SCREENING BILATERAL MAMMOGRAM WITH TOMOSYNTHESIS AND CAD
TECHNIQUE: Bilateral screening digital craniocaudal and mediolateral oblique
mammograms were obtained. Bilateral screening digital breast
tomosynthesis was performed. The images were evaluated with
computer-aided detection.

[L MLO synth-2D (1 of 2)]
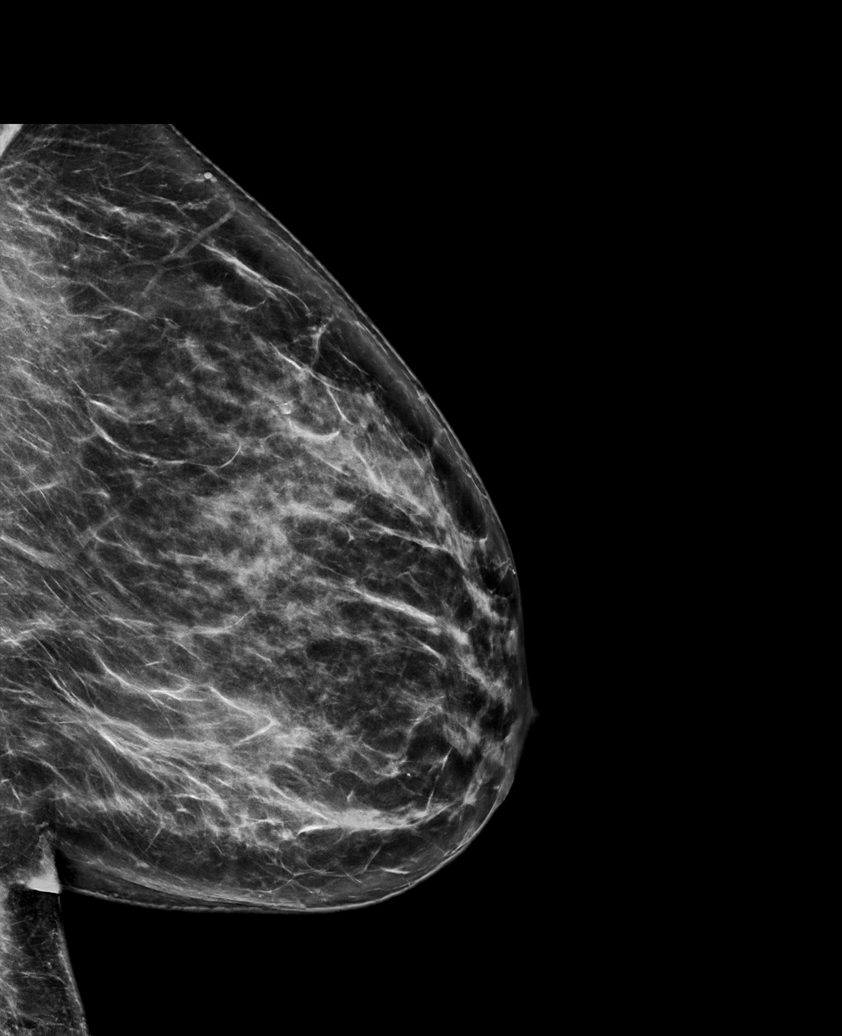

[R CC synth-2D]
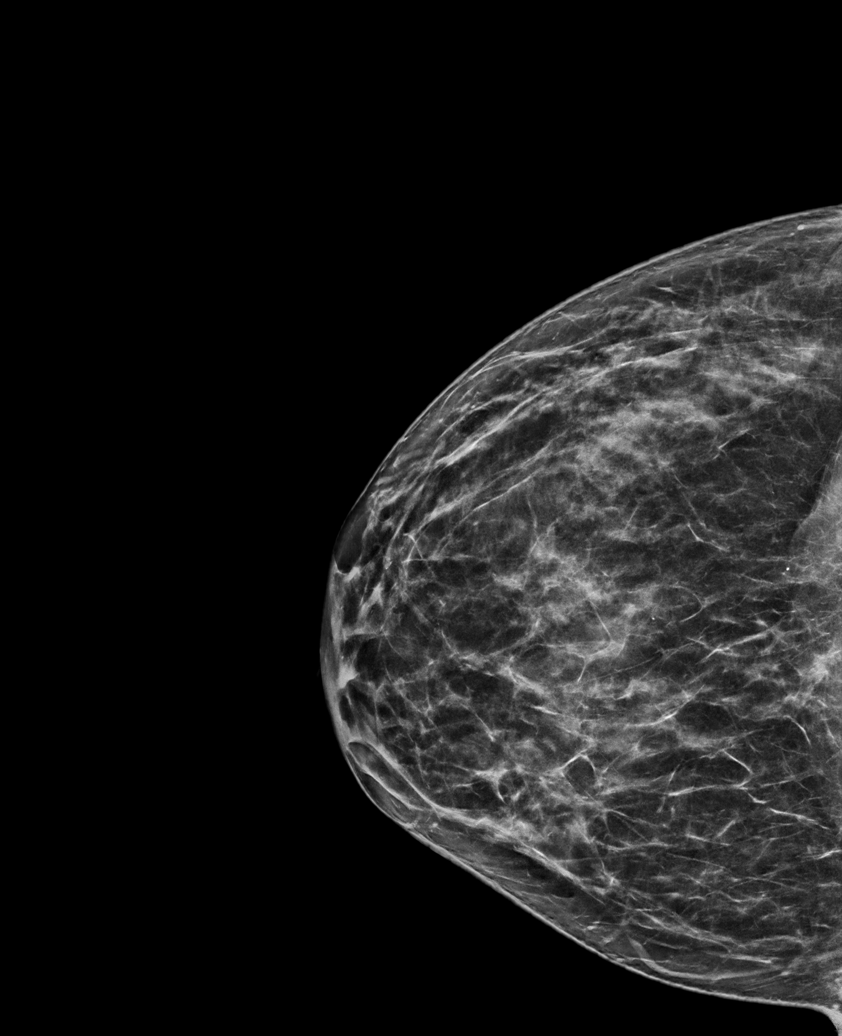

[R MLO synth-2D]
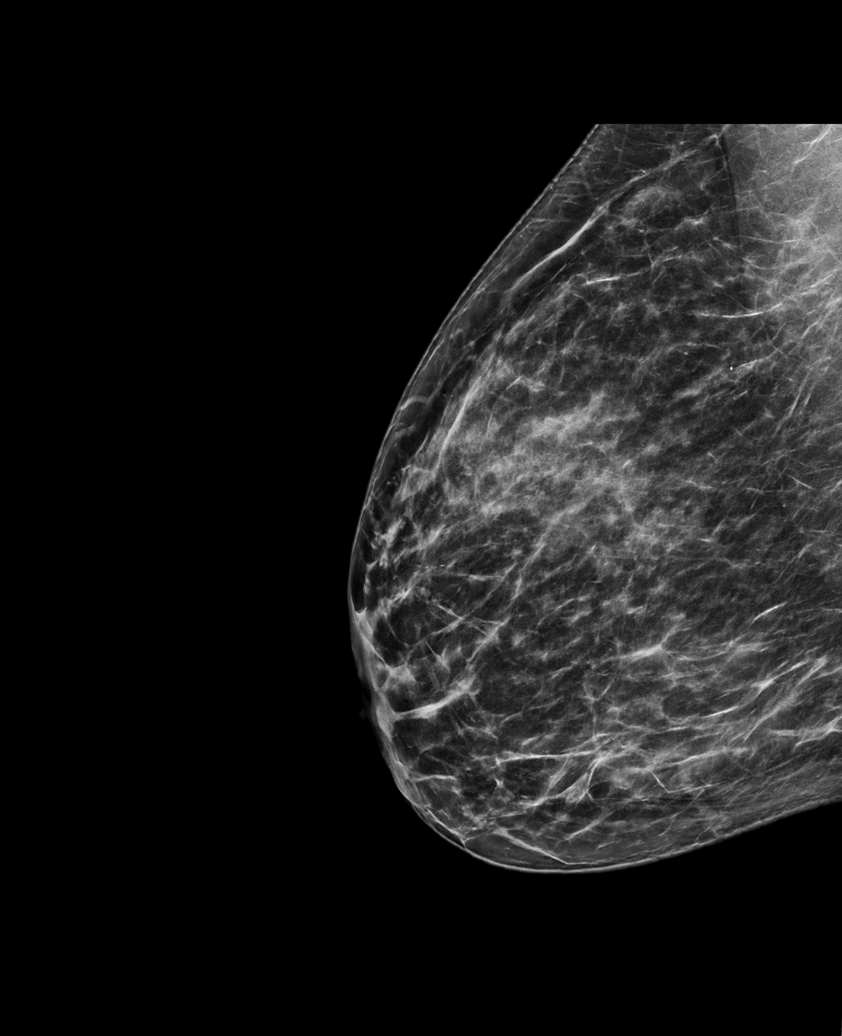

[L CC synth-2D]
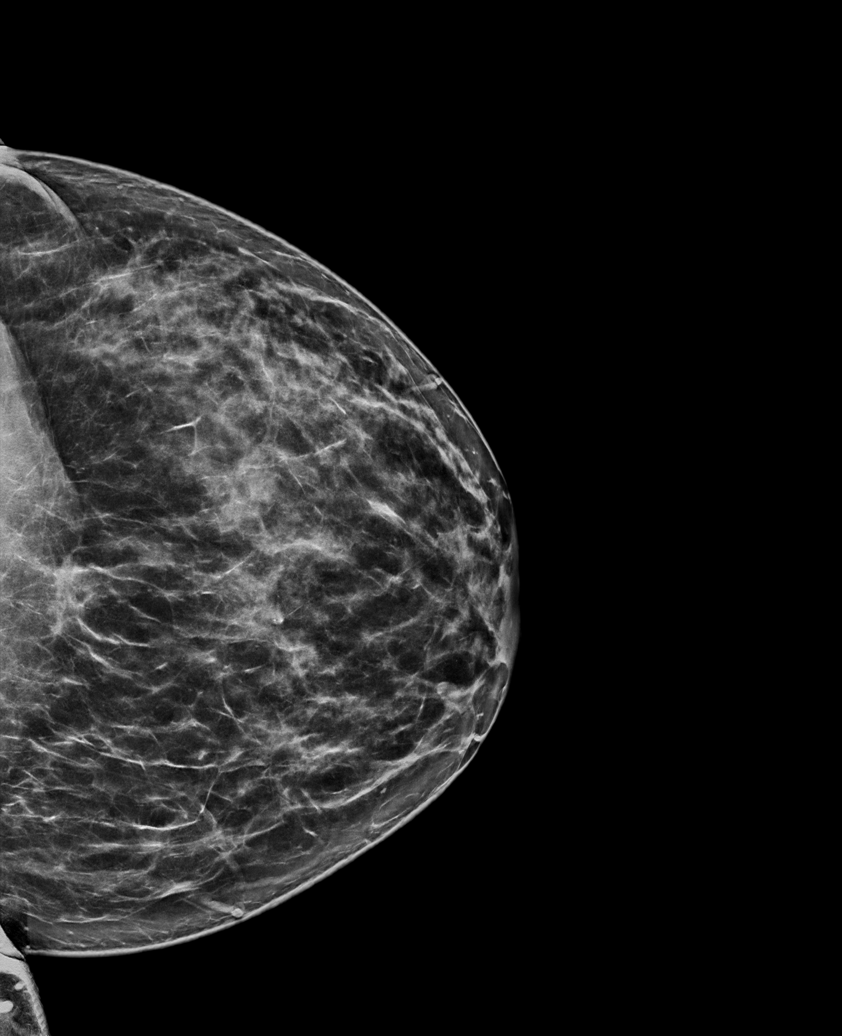

[L MLO synth-2D (2 of 2)]
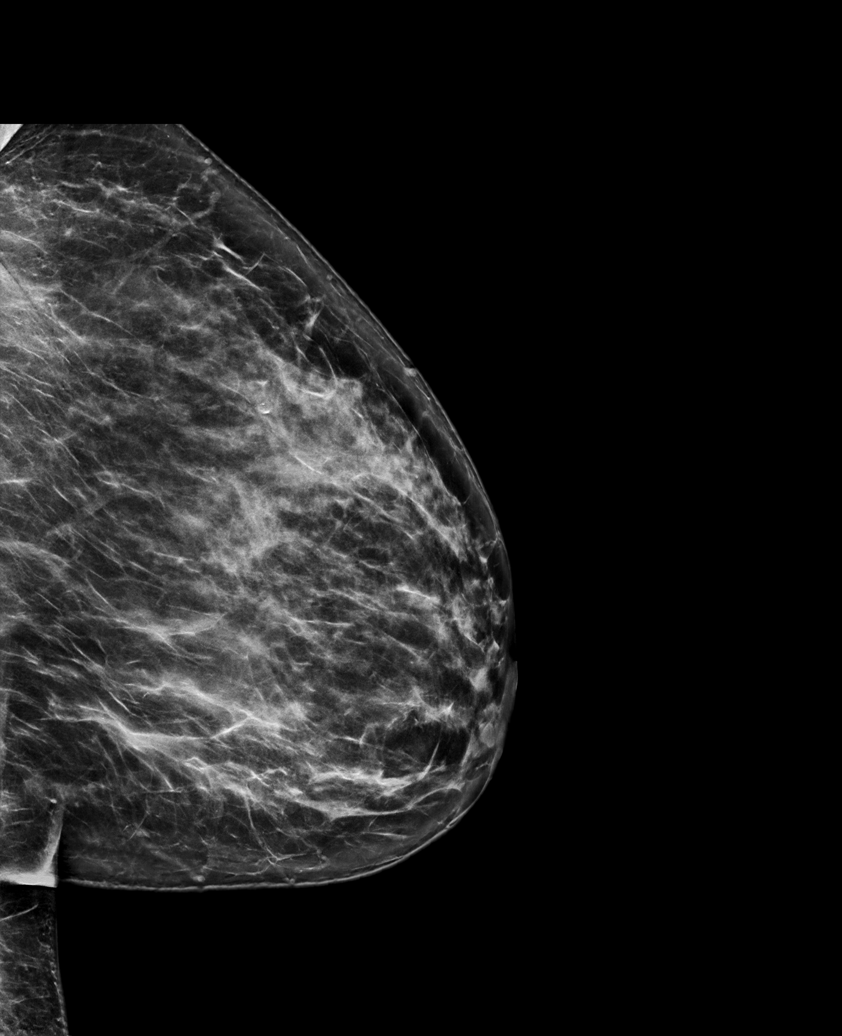

[L MLO tomo · tomo slice 40/79.0]
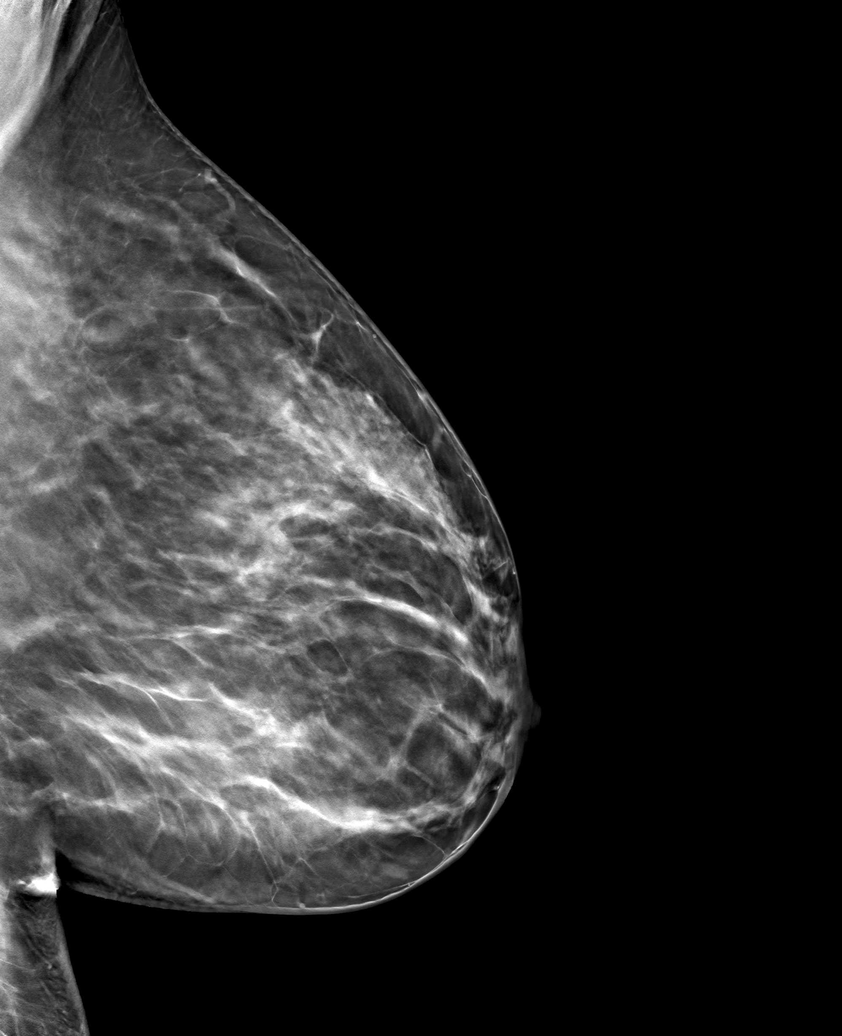

[6 of 30 positions shown; findings below may reference images not displayed]

ACR Breast Density Category c: The breast tissue is heterogeneously
dense, which may obscure small masses.
FINDINGS: There are no findings suspicious for malignancy.
IMPRESSION: No mammographic evidence of malignancy. A result letter of this
screening mammogram will be mailed directly to the patient.

RECOMMENDATION:
Screening mammogram in one year. (Code:Q3-[id number])

BI-RADS CATEGORY  1: Negative.

## 2022-05-05 ENCOUNTER — Ambulatory Visit (INDEPENDENT_AMBULATORY_CARE_PROVIDER_SITE_OTHER): Payer: Managed Care, Other (non HMO) | Admitting: Obstetrics and Gynecology

## 2022-05-05 ENCOUNTER — Other Ambulatory Visit: Payer: Managed Care, Other (non HMO)

## 2022-05-05 ENCOUNTER — Encounter: Payer: Self-pay | Admitting: Obstetrics and Gynecology

## 2022-05-05 DIAGNOSIS — O24415 Gestational diabetes mellitus in pregnancy, controlled by oral hypoglycemic drugs: Secondary | ICD-10-CM

## 2022-05-05 DIAGNOSIS — Z8632 Personal history of gestational diabetes: Secondary | ICD-10-CM

## 2022-05-05 DIAGNOSIS — R200 Anesthesia of skin: Secondary | ICD-10-CM

## 2022-05-05 MED ORDER — PHEXXI 1.8-1-0.4 % VA GEL: VAGINAL | 2 refills | Status: DC

## 2022-05-05 NOTE — Patient Instructions (Signed)

## 2022-05-05 NOTE — Progress Notes (Signed)
OBSTETRICS POSTPARTUM CLINIC PROGRESS NOTE  Subjective:     Brazil Pecorella is a 47 y.o. [phone] female who presents for a postpartum visit. She is 6 weeks postpartum following a spontaneous vaginal delivery. I have fully reviewed the prenatal and intrapartum course. The delivery was at 39.6 gestational weeks.  Anesthesia: epidural. Postpartum course has been good. Baby's course has been good. Baby is feeding by both breast and bottle - [employer] Goat Milk . Bleeding: patient has not not resumed menses, with No LMP recorded.. Bowel function is normal. Bladder function is normal. Patient is sexually active. Contraception method desired is  undecided, considering copper IUD . Postpartum depression screening: negative.  EDPS score is 0.   Still notes some right numbness and tingling in big toe of right foot. Notes that this has been going on since her epidural. Is constant. Denies pain or discoloration. Wonders if some nerve damage occurred.   The following portions of the patient's history were reviewed and updated as appropriate: allergies, current medications, past family history, past medical history, past social history, past surgical history, and problem list.  Review of Systems Pertinent items noted in HPI and remainder of comprehensive ROS otherwise negative.   Objective:    BP 136/89   Pulse 68   Resp 16   Ht 5\' 4"  (1.626 m)   Wt 172 lb 3.2 oz (78.1 kg)   Breastfeeding Yes   BMI 29.56 kg/m   General:  alert and no distress   Breasts:  inspection negative, no nipple discharge or bleeding, no masses or nodularity palpable  Lungs: clear to auscultation bilaterally  Heart:  regular rate and rhythm, S1, S2 normal, no murmur, click, rub or gallop  Abdomen: soft, non-tender; bowel sounds normal; no masses,  no organomegaly.     Vulva:  normal  Vagina: normal vagina, no discharge, exudate, lesion, or erythema  Cervix:  no cervical motion tenderness and no lesions  Corpus: normal size,  contour, position, consistency, mobility, non-tender  Adnexa:  normal adnexa and no mass, fullness, tenderness  Rectal Exam: Not performed.            05/05/2022   10:03 AM 04/07/2022    3:05 PM 03/26/2022   12:31 AM 08/24/2021    3:31 PM 09/04/2020   10:06 AM  Edinburgh Postnatal Depression Scale Screening Tool  I have been able to laugh and see the funny side of things. 0 0 0 0 0  I have looked forward with enjoyment to things. 0 0 0 1 1  I have blamed myself unnecessarily when things went wrong. 0 0 1 0 1  I have been anxious or worried for no good reason. 0 0 1 0 0  I have felt scared or panicky for no good reason. 0 0 1 0 0  Things have been getting on top of me. 0 0 0 2 1  I have been so unhappy that I have had difficulty sleeping. 0 0 1 0 1  I have felt sad or miserable. 0 0 0 2 1  I have been so unhappy that I have been crying. 0 0 0 1 1  The thought of harming myself has occurred to me. 0 0 1 0 0  Edinburgh Postnatal Depression Scale Total 0 0 5 6 6      Labs:  Lab Results  Component Value Date   HGB 10.4 (L) 03/25/2022     Assessment:   1. Postpartum care following vaginal delivery  2. History of diet controlled gestational diabetes mellitus (GDM)   3. Numbness of toes   4. Lactating mother      Plan:    1. Contraception:  discussed non-hormonal options with patient (per preference). After discussion, patient desires to trial Phexxi. Given handout and prescription written.  2. Completing 2 hr GTT today for h/o GDM.  3. Will consult Anesthesia regarding patient's concerns of persistent numbness/tingling in toe.  4. Breastfeeding and supplementing with formula, no concerns at this time.  4. Follow up in:  4-6  months for annual exam, or sooner or as needed.    Rubie Maid, MD [place]

## 2022-05-06 ENCOUNTER — Encounter: Payer: Self-pay | Admitting: Obstetrics

## 2022-05-06 LAB — GLUCOSE TOLERANCE, 2 HOURS
Glucose, 2 hour: 92 mg/dL (ref 70–139)
Glucose, GTT - Fasting: 82 mg/dL (ref 70–99)

## 2022-06-30 ENCOUNTER — Encounter: Payer: Self-pay | Admitting: Obstetrics and Gynecology

## 2022-07-01 ENCOUNTER — Other Ambulatory Visit: Payer: Self-pay

## 2022-07-01 MED ORDER — LIDOCAINE-PRILOCAINE 2.5-2.5 % EX CREA: 1.0000 | TOPICAL_CREAM | CUTANEOUS | 0 refills | Status: DC | PRN

## 2022-07-05 ENCOUNTER — Other Ambulatory Visit: Payer: Self-pay | Admitting: Obstetrics and Gynecology

## 2022-12-06 ENCOUNTER — Telehealth: Payer: Self-pay

## 2022-12-06 NOTE — Telephone Encounter (Signed)
Pt calling to see how she would go about getting some FMLA papers to [place]; adv fax, mail, bring in.  Pt states she sill bring them in.  Then she asked if they could be done even though it was a long time ago.  Pt states it's for mental issues; she has no therapist, is not on any meds, doesn't have a PCP; [facility] is it.  Adv to bring them in and go from there.  All we can do is ask.

## 2022-12-23 ENCOUNTER — Telehealth: Payer: Self-pay

## 2022-12-23 NOTE — Telephone Encounter (Signed)
I called Cheryl Macias as I have received some FMLA forms. I have some questions about the forms. Left voicemail

## 2022-12-27 ENCOUNTER — Ambulatory Visit: Payer: Self-pay | Admitting: Certified Nurse Midwife

## 2022-12-27 ENCOUNTER — Encounter: Payer: Self-pay | Admitting: Certified Nurse Midwife

## 2022-12-27 VITALS — BP 111/72 | HR 67 | Wt 179.5 lb

## 2022-12-27 DIAGNOSIS — Z0289 Encounter for other administrative examinations: Secondary | ICD-10-CM

## 2022-12-27 DIAGNOSIS — F53 Postpartum depression: Secondary | ICD-10-CM

## 2022-12-27 DIAGNOSIS — F4321 Adjustment disorder with depressed mood: Secondary | ICD-10-CM | POA: Diagnosis not present

## 2022-12-27 DIAGNOSIS — Z634 Disappearance and death of family member: Secondary | ICD-10-CM

## 2022-12-27 DIAGNOSIS — F419 Anxiety disorder, unspecified: Secondary | ICD-10-CM | POA: Diagnosis not present

## 2022-12-27 NOTE — Progress Notes (Signed)
Subjective:       Cheryl Macias is a 47 y.o. [phone] female who presents for a conference appointment. Current complaints include: mood changes, due to grief and anxiety. She has history of infant loss 2 years ago and gave birth to another child in February 2024. She states she is reliving grief from that experiences as well as having anxiety about her baby. States she is anxious and is checking on her baby all the time. She is not sleeping well.  This is affecting her work life. She is unable to perform at the level she did prior to her most recent birth.    Obstetric History OB History  Gravida Para Term Preterm AB Living  4 3 3   1 3   SAB IAB Ectopic Multiple Live Births  0 1   0 3    # Outcome Date GA Lbr Len/2nd Weight Sex Type Anes PTL Lv  4 Term 03/24/22 [redacted]w[redacted]d  8 lb 6.4 oz (3.81 kg) M Vag-Spont EPI  LIV  3 Term 07/24/20 [redacted]w[redacted]d  4 lb 8 oz (2.041 kg) M Vag-Spont  N DEC     Complications: Trisomy 18  2 Term 09/21/01 [redacted]w[redacted]d  6 lb 13 oz (3.09 kg) M Vag-Spont  N LIV  1 IAB             Past Medical History:  Diagnosis Date   Abnormal Pap smear    Anemia    Gestational diabetes    Gonorrhea 02/2020   History of herpes genitalis 12/29/2021   Prediabetes    Vitamin D deficiency     Past Surgical History:  Procedure Laterality Date   BUNIONECTOMY     BOTH FEET   CRYOTHERAPY     MYOMECTOMY     vaginal    Current Outpatient Medications on File Prior to Visit  Medication Sig Dispense Refill   lidocaine-prilocaine (EMLA) cream Apply 1 Application topically as needed. Apply 30 minutes before appointment. 30 g 0   Prenatal Vit-Fe Fumarate-FA (PRENATAL MULTIVITAMIN) TABS tablet Take 1 tablet by mouth daily at 12 noon.     Lactic Ac-Citric Ac-Pot Bitart (PHEXXI) 1.8-1-0.4 % GEL Apply 1 applicator vaginally immediately before or up to 1 hour before each episode of vaginal intercourse. (Patient not taking: Reported on 12/27/2022) 180 g 2   valACYclovir (VALTREX) 1000 MG tablet TAKE 1  TABLET BY MOUTH DAILY (Patient not taking: Reported on 12/27/2022) 90 tablet 3   No current facility-administered medications on file prior to visit.    No Known Allergies  Social History   Socioeconomic History   Marital status: Married    Spouse name: Renae Fickle   Number of children: 1   Years of education: 18   Highest education level: Not on file  Occupational History   Occupation: Building control surveyor: [employer]  Tobacco Use   Smoking status: Never   Smokeless tobacco: Never  Vaping Use   Vaping status: Never Used  Substance and Sexual Activity   Alcohol use: Not Currently   Drug use: No   Sexual activity: Yes    Partners: Male    Birth control/protection: None  Other Topics Concern   Not on file  Social History Narrative   Not on file   Social Determinants of Health   Financial Resource Strain: Low Risk  (08/24/2021)   Overall Financial Resource Strain (CARDIA)    Difficulty of Paying Living Expenses: Not hard at all  Food Insecurity: No Food Insecurity (  03/24/2022)   Hunger Vital Sign    Worried About Running Out of Food in the Last Year: Never true    Ran Out of Food in the Last Year: Never true  Transportation Needs: No Transportation Needs (03/24/2022)   PRAPARE - Administrator, Civil Service (Medical): No    Lack of Transportation (Non-Medical): No  Physical Activity: Insufficiently Active (08/24/2021)   Exercise Vital Sign    Days of Exercise per Week: 3 days    Minutes of Exercise per Session: 30 min  Stress: No Stress Concern Present (08/24/2021)   [employer] of Occupational Health - Occupational Stress Questionnaire    Feeling of Stress : Not at all  Social Connections: Moderately Integrated (08/24/2021)   Social Connection and Isolation Panel [NHANES]    Frequency of Communication with Friends and Family: Twice a week    Frequency of Social Gatherings with Friends and Family: Once a week    Attends Religious Services: Never    Automotive engineer or Organizations: Yes    Attends Banker Meetings: Never    Marital Status: Married  Catering manager Violence: Not At Risk (08/24/2021)   Humiliation, Afraid, Rape, and Kick questionnaire    Fear of Current or Ex-Partner: No    Emotionally Abused: No    Physically Abused: No    Sexually Abused: No    Family History  Problem Relation Age of Onset   Hypertension Father    Lupus Father    Diabetes Paternal Grandmother    Breast cancer Neg Hx     The following portions of the patient's history were reviewed and updated as appropriate: allergies, current medications, past family history, past medical history, past social history, past surgical history and problem list.  Review of Systems Pertinent items are noted in HPI.   Objective:   BP 111/72   Pulse 67   Wt 179 lb 8 oz (81.4 kg)   Breastfeeding Yes   BMI 30.81 kg/m      12/27/2022    9:43 AM  GAD 7 : Generalized Anxiety Score  Nervous, Anxious, on Edge 1  Control/stop worrying 1  Worry too much - different things 1  Trouble relaxing 1  Restless 0  Easily annoyed or irritable 1  Afraid - awful might happen 0  Total GAD 7 Score 5  Anxiety Difficulty Somewhat difficult    Flowsheet Row Office Visit from 12/27/2022 in [facility] Total Score 6       Assessment/Plan:   Patient Active Problem List   Diagnosis Date Noted   Postpartum care following vaginal delivery 04/07/2022   Advanced maternal age in multigravida, third trimester 03/24/2022   Screen for STD (sexually transmitted disease) 03/22/2022   Pregnancy w/ hx of uterine myomectomy 03/22/2022   Gestational diabetes mellitus (GDM) controlled on oral hypoglycemic drug 03/22/2022   AMA (advanced maternal age) multigravida 35+, third trimester 01/25/2022   History of herpes genitalis 12/29/2021   Pregnancy, supervision, high-risk 10/26/2021   Family history of genetic disorder 08/31/2021    Newborn product of in vitro fertilization (IVF) pregnancy 08/31/2021   Osteoarthritis of knee 02/01/2018    She is requesting counseling and phychiatric treatment. She has been wait listed for both. Information given on PSI and parents helping parents . Orders placed for referral . She declines medications at this time.    Time: 15  Return to Clinic: prn   Doreene Burke,  CNM [facility]

## 2023-02-14 ENCOUNTER — Encounter: Payer: Self-pay | Admitting: Obstetrics and Gynecology

## 2023-05-10 ENCOUNTER — Encounter: Payer: Self-pay | Admitting: Certified Nurse Midwife

## 2023-05-10 ENCOUNTER — Ambulatory Visit: Admitting: Certified Nurse Midwife

## 2023-05-10 VITALS — BP 125/82 | HR 67 | Ht 64.0 in | Wt 180.0 lb

## 2023-05-10 DIAGNOSIS — F4321 Adjustment disorder with depressed mood: Secondary | ICD-10-CM

## 2023-05-10 DIAGNOSIS — F3289 Other specified depressive episodes: Secondary | ICD-10-CM

## 2023-05-10 DIAGNOSIS — F419 Anxiety disorder, unspecified: Secondary | ICD-10-CM | POA: Diagnosis not present

## 2023-05-10 NOTE — Progress Notes (Signed)
 GYN ENCOUNTER NOTE  Subjective:       Cheryl Macias is a 48 y.o. [phone] female is here for gynecologic evaluation of the following issues:  1. She was seen 12/27/2022 in regards to depression and anxiety from a loss she of a baby she had prior to her most recent delivery February 2024. She has been seeing an psychologist and this has helped but she does not feel that she as ready to go back to work at this time. She would like to remain out until April.      Obstetric History OB History  Gravida Para Term Preterm AB Living  4 3 3  1 3   SAB IAB Ectopic Multiple Live Births  0 1  0 3    # Outcome Date GA Lbr Len/2nd Weight Sex Type Anes PTL Lv  4 Term 03/24/22 [redacted]w[redacted]d  8 lb 6.4 oz (3.81 kg) M Vag-Spont EPI  LIV  3 Term 07/24/20 [redacted]w[redacted]d  4 lb 8 oz (2.041 kg) M Vag-Spont  N DEC     Complications: Trisomy 18  2 Term 09/21/01 [redacted]w[redacted]d  6 lb 13 oz (3.09 kg) M Vag-Spont  N LIV  1 IAB             Past Medical History:  Diagnosis Date   Abnormal Pap smear    Anemia    Gestational diabetes    Gonorrhea 02/2020   History of herpes genitalis 12/29/2021   Prediabetes    Vitamin D deficiency     Past Surgical History:  Procedure Laterality Date   BUNIONECTOMY     BOTH FEET   CRYOTHERAPY     MYOMECTOMY     vaginal    Current Outpatient Medications on File Prior to Visit  Medication Sig Dispense Refill   Prenatal Vit-Fe Fumarate-FA (PRENATAL MULTIVITAMIN) TABS tablet Take 1 tablet by mouth daily at 12 noon.     No current facility-administered medications on file prior to visit.    No Known Allergies  Social History   Socioeconomic History   Marital status: Married    Spouse name: Renae Fickle   Number of children: 1   Years of education: 18   Highest education level: Not on file  Occupational History   Occupation: Building control surveyor: [employer]  Tobacco Use   Smoking status: Never   Smokeless tobacco: Never  Vaping Use   Vaping status: Never Used  Substance and Sexual Activity    Alcohol use: Not Currently   Drug use: No   Sexual activity: Yes    Partners: Male    Birth control/protection: None  Other Topics Concern   Not on file  Social History Narrative   Not on file   Social Drivers of Health   Financial Resource Strain: Low Risk  (08/24/2021)   Overall Financial Resource Strain (CARDIA)    Difficulty of Paying Living Expenses: Not hard at all  Food Insecurity: No Food Insecurity (03/24/2022)   Hunger Vital Sign    Worried About Running Out of Food in the Last Year: Never true    Ran Out of Food in the Last Year: Never true  Transportation Needs: No Transportation Needs (03/24/2022)   PRAPARE - Administrator, Civil Service (Medical): No    Lack of Transportation (Non-Medical): No  Physical Activity: Insufficiently Active (08/24/2021)   Exercise Vital Sign    Days of Exercise per Week: 3 days    Minutes of Exercise per Session: 30 min  Stress: No Stress Concern Present (08/24/2021)   [employer] of Occupational Health - Occupational Stress Questionnaire    Feeling of Stress : Not at all  Social Connections: Moderately Integrated (08/24/2021)   Social Connection and Isolation Panel [NHANES]    Frequency of Communication with Friends and Family: Twice a week    Frequency of Social Gatherings with Friends and Family: Once a week    Attends Religious Services: Never    Database administrator or Organizations: Yes    Attends Banker Meetings: Never    Marital Status: Married  Catering manager Violence: Not At Risk (08/24/2021)   Humiliation, Afraid, Rape, and Kick questionnaire    Fear of Current or Ex-Partner: No    Emotionally Abused: No    Physically Abused: No    Sexually Abused: No    Family History  Problem Relation Age of Onset   Hypertension Father    Lupus Father    Diabetes Paternal Grandmother    Breast cancer Neg Hx     The following portions of the patient's history were reviewed and updated as appropriate:  allergies, current medications, past family history, past medical history, past social history, past surgical history and problem list.  Review of Systems Review of Systems - Negative except as mentioned in HPI Review of Systems - General ROS: negative for - chills, fatigue, fever, hot flashes, malaise or night sweats Hematological and Lymphatic ROS: negative for - bleeding problems or swollen lymph nodes Gastrointestinal ROS: negative for - abdominal pain, blood in stools, change in bowel habits and nausea/vomiting Musculoskeletal ROS: negative for - joint pain, muscle pain or muscular weakness Genito-Urinary ROS: negative for - change in menstrual cycle, dysmenorrhea, dyspareunia, dysuria, genital discharge, genital ulcers, hematuria, incontinence, irregular/heavy menses, nocturia or pelvic pain. Positive for anxiety relating to previous loss of a child.   Objective:   BP 125/82   Pulse 67   Ht 5\' 4"  (1.626 m)   Wt 180 lb (81.6 kg)   LMP 04/14/2023 (Exact Date)   Breastfeeding No   BMI 30.90 kg/m  CONSTITUTIONAL: Well-developed, well-nourished female in no acute distress.  HENT:  Normocephalic, atraumatic.  NECK: Normal range of motion, supple, no masses.  Normal thyroid.  SKIN: Skin is warm and dry. No rash noted. Not diaphoretic. No erythema. No pallor. NEUROLGIC: Alert and oriented to person, place, and time. PSYCHIATRIC: Normal mood and affect. Normal behavior. Normal judgment and thought content.     05/10/2023    3:39 PM 12/27/2022    9:41 AM 02/17/2022    4:05 PM  PHQ9 SCORE ONLY  PHQ-9 Total Score 2 6 0       05/10/2023    3:39 PM 12/27/2022    9:43 AM  GAD 7 : Generalized Anxiety Score  Nervous, Anxious, on Edge 1 1  Control/stop worrying 1 1  Worry too much - different things 1 1  Trouble relaxing 0 1  Restless 0 0  Easily annoyed or irritable 0 1  Afraid - awful might happen 0 0  Total GAD 7 Score 3 5  Anxiety Difficulty Somewhat difficult Somewhat difficult        Assessment:   1. Other depression (Primary) 2. Anxiety 3 Grief      Plan:   Discussed that she has made some improvement and noted that she may not be emotionally ready to return to work in April and given that she has made improvement that returning to work in April  may be helpful in the recovery process. She states she is trying to come to terms with this realization that she may not feel ready to go back but may need to. She is working with her psychologist to get to that point.  She continues to decline medication and states that she prefers to continue with counseling.   Face to face time 10 minutes  Theola Sequin, CNM

## 2023-05-27 ENCOUNTER — Encounter: Payer: Self-pay | Admitting: Certified Nurse Midwife

## 2023-07-13 ENCOUNTER — Ambulatory Visit (INDEPENDENT_AMBULATORY_CARE_PROVIDER_SITE_OTHER): Admitting: Professional Counselor

## 2023-07-13 DIAGNOSIS — F4321 Adjustment disorder with depressed mood: Secondary | ICD-10-CM
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
## 2023-08-28 ENCOUNTER — Ambulatory Visit: Admitting: Professional Counselor

## 2023-09-11 ENCOUNTER — Ambulatory Visit (INDEPENDENT_AMBULATORY_CARE_PROVIDER_SITE_OTHER): Admitting: Professional Counselor

## 2023-09-11 DIAGNOSIS — F4381 Prolonged grief disorder: Secondary | ICD-10-CM | POA: Diagnosis not present

## 2023-09-11 DIAGNOSIS — F4321 Adjustment disorder with depressed mood: Secondary | ICD-10-CM

## 2023-09-11 NOTE — Progress Notes (Unsigned)
  THERAPIST PROGRESS NOTE  Session Time: 4:07 PM - 4:52 PM  Participation Level: Active  Behavioral Response: Well Groomed, Alert, Guarded  Type of Therapy: Individual Therapy  Treatment Goals addressed: Active Anxiety  LTG: To better be able to handle my emotions and my reactions. Just managing anxiety, ruminating on the past, regrets, and thoughts like that.     Start:  09/11/23    Expected End:  09/09/24     STG: To reduce sxs of anxiety AEB reduction in GAD7 scores by using coping mechanisms over the next 12 weeks.    STG: To reduce impact of grief/trauma/negative life experiences AEB processing and restructuring unhealthy patterns of thinking over the next 12 weeks.     ProgressTowards Goals: Initial  Interventions: Motivational Interviewing, Supportive, and Other: Coping skills  Summary: Cheryl Macias is a 48 y.o. female who presents with complicated grief. She reported improvements since her initial CCA. She has continued to engage in therapy with EAP provider. She noted she still has a couple sessions with them. [name] identified struggles with over-thinking and ruminating on the past, and feelings of regret. She engaged in developing her treatment plan. She talked about her son's death and impact it had on her and her marriage. She noted emotion regulation skills, changed thinking, and improved self-care as coping strategies. She was receptive to additional coping skills discussed in session.  Therapist Response: Conducted session with [name]. Began session with check-in/update since previous session. Utilized empathetic and reflective listening. Developed treatment plan with input from [name] on current strengths, needs, and progress towards goals. Used open-ended questions to facilitate discussion and summarized thoughts/feelings. Explained coping mechanisms (breathing exercises, 5-4-3-2-1 grounding skill, TIP, STOP, and EFT tapping.) [name]. Scheduled  additional appointment and concluded session.   Suicidal/Homicidal: No  Plan: Return again in 5 weeks.  Diagnosis: Complicated grief  Collaboration of Care: N/A  Patient/Guardian was advised Release of Information must be obtained prior to any record release in order to collaborate their care with an outside provider. Patient/Guardian was advised if they have not already done so to contact the registration department to sign all necessary forms in order for us  to release information regarding their care.   Consent: Patient/Guardian gives verbal consent for treatment and assignment of benefits for services provided during this visit. Patient/Guardian expressed understanding and agreed to proceed.   [name], [facility] 09/11/2023

## 2023-10-19 ENCOUNTER — Ambulatory Visit: Admitting: Professional Counselor
